# Patient Record
Sex: Male | Born: 1970 | Race: Black or African American | Hispanic: No | Marital: Married | Smoking: Former smoker
Health system: Southern US, Community
[De-identification: ages and names within clinical notes are randomized; demographics above are authoritative.]

## PROBLEM LIST (undated history)

## (undated) DIAGNOSIS — I10 Essential (primary) hypertension: Secondary | ICD-10-CM

## (undated) DIAGNOSIS — J329 Chronic sinusitis, unspecified: Secondary | ICD-10-CM

## (undated) DIAGNOSIS — E1110 Type 2 diabetes mellitus with ketoacidosis without coma: Secondary | ICD-10-CM

## (undated) HISTORY — PX: NO PAST SURGERIES: SHX2092

## (undated) HISTORY — DX: Essential (primary) hypertension: I10

---

## 2011-02-15 LAB — BASIC METABOLIC PANEL
BUN: 13 mg/dL (ref 4–21)
Potassium: 3.9 mmol/L (ref 3.4–5.3)

## 2011-08-12 ENCOUNTER — Encounter: Payer: Self-pay | Admitting: *Deleted

## 2011-08-12 DIAGNOSIS — I10 Essential (primary) hypertension: Secondary | ICD-10-CM | POA: Insufficient documentation

## 2011-08-12 DIAGNOSIS — E119 Type 2 diabetes mellitus without complications: Secondary | ICD-10-CM | POA: Insufficient documentation

## 2011-09-13 ENCOUNTER — Ambulatory Visit: Payer: Self-pay | Admitting: Emergency Medicine

## 2012-05-10 ENCOUNTER — Encounter (HOSPITAL_COMMUNITY): Payer: Self-pay | Admitting: Family Medicine

## 2012-05-10 ENCOUNTER — Inpatient Hospital Stay (HOSPITAL_COMMUNITY): Payer: 59

## 2012-05-10 ENCOUNTER — Emergency Department (HOSPITAL_COMMUNITY): Payer: 59

## 2012-05-10 ENCOUNTER — Inpatient Hospital Stay (HOSPITAL_COMMUNITY)
Admission: EM | Admit: 2012-05-10 | Discharge: 2012-05-15 | DRG: 179 | Disposition: A | Discharge status: Home / Self Care | Payer: 59 | Attending: Internal Medicine | Admitting: Internal Medicine

## 2012-05-10 DIAGNOSIS — I10 Essential (primary) hypertension: Secondary | ICD-10-CM | POA: Diagnosis present

## 2012-05-10 DIAGNOSIS — J850 Gangrene and necrosis of lung: Secondary | ICD-10-CM | POA: Diagnosis present

## 2012-05-10 DIAGNOSIS — E1142 Type 2 diabetes mellitus with diabetic polyneuropathy: Secondary | ICD-10-CM | POA: Diagnosis present

## 2012-05-10 DIAGNOSIS — E1149 Type 2 diabetes mellitus with other diabetic neurological complication: Secondary | ICD-10-CM | POA: Diagnosis present

## 2012-05-10 DIAGNOSIS — J189 Pneumonia, unspecified organism: Secondary | ICD-10-CM

## 2012-05-10 DIAGNOSIS — J852 Abscess of lung without pneumonia: Principal | ICD-10-CM | POA: Diagnosis present

## 2012-05-10 DIAGNOSIS — E119 Type 2 diabetes mellitus without complications: Secondary | ICD-10-CM

## 2012-05-10 LAB — POCT I-STAT, CHEM 8
BUN: 10 mg/dL (ref 6–23)
Calcium, Ion: 1.27 mmol/L — ABNORMAL HIGH (ref 1.12–1.23)
Creatinine, Ser: 0.6 mg/dL (ref 0.50–1.35)
Hemoglobin: 12.9 g/dL — ABNORMAL LOW (ref 13.0–17.0)
Sodium: 131 mEq/L — ABNORMAL LOW (ref 135–145)
TCO2: 14 mmol/L (ref 0–100)

## 2012-05-10 LAB — CBC
Hemoglobin: 11.9 g/dL — ABNORMAL LOW (ref 13.0–17.0)
MCH: 25 pg — ABNORMAL LOW (ref 26.0–34.0)
MCHC: 33.1 g/dL (ref 30.0–36.0)
MCV: 75.4 fL — ABNORMAL LOW (ref 78.0–100.0)
RBC: 4.76 MIL/uL (ref 4.22–5.81)

## 2012-05-10 LAB — GLUCOSE, CAPILLARY

## 2012-05-10 MED ORDER — VANCOMYCIN HCL IN DEXTROSE 1-5 GM/200ML-% IV SOLN
1000.0000 mg | Freq: Once | INTRAVENOUS | Status: AC
  Administered 2012-05-10: 1000 mg via INTRAVENOUS
  Filled 2012-05-10: qty 200

## 2012-05-10 MED ORDER — ONDANSETRON HCL 4 MG/2ML IJ SOLN
4.0000 mg | Freq: Once | INTRAMUSCULAR | Status: AC
  Administered 2012-05-10: 4 mg via INTRAVENOUS
  Filled 2012-05-10: qty 2

## 2012-05-10 MED ORDER — INSULIN ASPART 100 UNIT/ML ~~LOC~~ SOLN
0.0000 [IU] | Freq: Three times a day (TID) | SUBCUTANEOUS | Status: DC
  Administered 2012-05-10: 11 [IU] via SUBCUTANEOUS
  Administered 2012-05-10: 15 [IU] via SUBCUTANEOUS
  Administered 2012-05-11: 7 [IU] via SUBCUTANEOUS
  Administered 2012-05-11: 15 [IU] via SUBCUTANEOUS
  Administered 2012-05-11: 11 [IU] via SUBCUTANEOUS
  Administered 2012-05-12 – 2012-05-13 (×4): 7 [IU] via SUBCUTANEOUS
  Administered 2012-05-13 (×2): 11 [IU] via SUBCUTANEOUS
  Administered 2012-05-14 – 2012-05-15 (×4): 7 [IU] via SUBCUTANEOUS

## 2012-05-10 MED ORDER — INSULIN GLARGINE 100 UNIT/ML ~~LOC~~ SOLN: 5.0000 [IU] | Freq: Every day | SUBCUTANEOUS | Status: DC

## 2012-05-10 MED ORDER — ALBUTEROL SULFATE (5 MG/ML) 0.5% IN NEBU
2.5000 mg | INHALATION_SOLUTION | RESPIRATORY_TRACT | Status: DC | PRN
  Filled 2012-05-10: qty 0.5

## 2012-05-10 MED ORDER — MORPHINE SULFATE 2 MG/ML IJ SOLN
2.0000 mg | INTRAMUSCULAR | Status: DC | PRN
  Administered 2012-05-10 – 2012-05-11 (×4): 2 mg via INTRAVENOUS
  Filled 2012-05-10 (×4): qty 1

## 2012-05-10 MED ORDER — INSULIN ASPART PROT & ASPART (70-30 MIX) 100 UNIT/ML ~~LOC~~ SUSP
10.0000 [IU] | Freq: Once | SUBCUTANEOUS | Status: DC
  Filled 2012-05-10 (×2): qty 10

## 2012-05-10 MED ORDER — INSULIN ASPART 100 UNIT/ML ~~LOC~~ SOLN
0.0000 [IU] | Freq: Every day | SUBCUTANEOUS | Status: DC
  Administered 2012-05-10: 4 [IU] via SUBCUTANEOUS
  Administered 2012-05-11: 3 [IU] via SUBCUTANEOUS
  Administered 2012-05-12: 4 [IU] via SUBCUTANEOUS
  Administered 2012-05-13 – 2012-05-14 (×2): 2 [IU] via SUBCUTANEOUS

## 2012-05-10 MED ORDER — INSULIN ASPART 100 UNIT/ML ~~LOC~~ SOLN
SUBCUTANEOUS | Status: AC
  Filled 2012-05-10: qty 10

## 2012-05-10 MED ORDER — TUBERCULIN PPD 5 UNIT/0.1ML ID SOLN
5.0000 [IU] | Freq: Once | INTRADERMAL | Status: AC
  Administered 2012-05-10: 5 [IU] via INTRADERMAL
  Filled 2012-05-10 (×3): qty 0.1

## 2012-05-10 MED ORDER — VANCOMYCIN HCL 1000 MG IV SOLR
1250.0000 mg | Freq: Two times a day (BID) | INTRAVENOUS | Status: DC
  Administered 2012-05-10 – 2012-05-14 (×8): 1250 mg via INTRAVENOUS
  Filled 2012-05-10 (×10): qty 1250

## 2012-05-10 MED ORDER — METOPROLOL TARTRATE 25 MG PO TABS
25.0000 mg | ORAL_TABLET | Freq: Two times a day (BID) | ORAL | Status: DC
  Administered 2012-05-10 – 2012-05-15 (×11): 25 mg via ORAL
  Filled 2012-05-10 (×12): qty 1

## 2012-05-10 MED ORDER — FENTANYL CITRATE 0.05 MG/ML IJ SOLN
50.0000 ug | Freq: Once | INTRAMUSCULAR | Status: AC
  Administered 2012-05-10: 50 ug via INTRAVENOUS
  Filled 2012-05-10: qty 2

## 2012-05-10 MED ORDER — PIPERACILLIN-TAZOBACTAM 3.375 G IVPB
3.3750 g | Freq: Once | INTRAVENOUS | Status: AC
  Administered 2012-05-10: 3.375 g via INTRAVENOUS
  Filled 2012-05-10: qty 50

## 2012-05-10 MED ORDER — PIPERACILLIN-TAZOBACTAM 3.375 G IVPB
3.3750 g | Freq: Three times a day (TID) | INTRAVENOUS | Status: DC
  Administered 2012-05-10 – 2012-05-15 (×15): 3.375 g via INTRAVENOUS
  Filled 2012-05-10 (×18): qty 50

## 2012-05-10 MED ORDER — INSULIN GLARGINE 100 UNIT/ML ~~LOC~~ SOLN
10.0000 [IU] | Freq: Every day | SUBCUTANEOUS | Status: DC
  Administered 2012-05-10: 10 [IU] via SUBCUTANEOUS

## 2012-05-10 NOTE — ED Notes (Signed)
NS infusing as backup to Vancomycin

## 2012-05-10 NOTE — Progress Notes (Signed)
Text page sent to Dr Rito Ehrlich to inform him of pt on unit room 1517.

## 2012-05-10 NOTE — ED Notes (Signed)
Patient states that he has been coughing and cough has a bad odor. Today has shortness of breath and pain in left rib area.

## 2012-05-10 NOTE — Progress Notes (Signed)
ANTIBIOTIC CONSULT NOTE - INITIAL  Pharmacy Consult for Vancomycin/Zosyn  Indication: Atypical PNA  No Known Allergies  Patient Measurements: Height: 6\' 1"  (185.4 cm) Weight: 275 lb (124.739 kg) IBW/kg (Calculated) : 79.9    Vital Signs: Temp: 98.2 F (36.8 C) (10/10 1022) Temp src: Oral (10/10 1022) BP: 144/88 mmHg (10/10 1022) Pulse Rate: 99  (10/10 1022) Intake/Output from previous day:   Intake/Output from this shift: Total I/O In: -  Out: 700 [Urine:700]  Labs:  [facility] 05/10/12 0632 05/10/12 0625  WBC -- 15.5*  HGB 12.9* 11.9*  PLT -- 309  LABCREA -- --  CREATININE 0.60 --   Estimated Creatinine Clearance: 164.7 ml/min (by C-G formula based on Cr of 0.6). No results found for this basename: VANCOTROUGH:2,VANCOPEAK:2,VANCORANDOM:2,GENTTROUGH:2,GENTPEAK:2,GENTRANDOM:2,TOBRATROUGH:2,TOBRAPEAK:2,TOBRARND:2,AMIKACINPEAK:2,AMIKACINTROU:2,AMIKACIN:2, in the last 72 hours   Microbiology: No results found for this or any previous visit (from the past 720 hour(s)).  Medical History: Past Medical History  Diagnosis Date  . Hypertension   . Diabetes mellitus     Medications:  Scheduled:    . fentaNYL  50 mcg Intravenous Once  . insulin aspart      . insulin aspart      . insulin aspart  0-20 Units Subcutaneous TID WC  . insulin aspart  0-5 Units Subcutaneous QHS  . insulin aspart protamine-insulin aspart  10 Units Subcutaneous Once  . insulin glargine  5 Units Subcutaneous QHS  . metoprolol tartrate  25 mg Oral BID  . ondansetron  4 mg Intravenous Once  . piperacillin-tazobactam (ZOSYN)  IV  3.375 g Intravenous Once  . piperacillin-tazobactam (ZOSYN)  IV  3.375 g Intravenous Q8H  . tuberculin  5 Units Intradermal Once  . vancomycin  1,250 mg Intravenous Q12H  . vancomycin  1,000 mg Intravenous Once  . vancomycin  1,000 mg Intravenous Once   Infusions:   PRN: albuterol, morphine injection Assessment:  41 yo M from home, with necrotizing PNA with  pulmonary abscess started Vancomycin and Zosyn today.  Patient received 2 gram load in ER based on weight of 125 kg, Zosyn also given in ER.   Renal function wnl, WBC elevated, AF  Blood, sputum, AFB cultures pending   Goal of Therapy:  Vancomycin trough level 15-20 mcg/ml Zosyn per renal function   Plan:  1.) Vancomycin 2 gram load given in ER, Continue with Vancomycin 1250 mg IV q12h 2.) Continue Zosyn 3.375 gram IV q8h  3.) Monitor renal function 4.) F/u Cultures   Medha Pippen, Loma Messing PharmD Pager #: [phone] 1:12 PM 05/10/2012

## 2012-05-10 NOTE — Progress Notes (Signed)
portable equipment call about order  For scd's, states that none are available  at this time, but she will put [place] on the list.

## 2012-05-10 NOTE — Progress Notes (Signed)
scd's placed on pt as ordered.

## 2012-05-10 NOTE — ED Provider Notes (Signed)
History     CSN: [id number]  Arrival date & time 05/10/12  0214   First MD Initiated Contact with Patient 05/10/12 [phone]      Chief Complaint  Patient presents with  . Shortness of Breath    (Consider location/radiation/quality/duration/timing/severity/associated sxs/prior treatment) HPI Hx per PT. Wife and daughter have been ill at home, yesterday developed cough, L sided sharp pain with coughing and foul odor with his cough.   No hemoptysis, no fevers, no rash or recent travel. PT denies any medical history or medications, is a smoker. No sig SOB but does hurt to cough and breath. Symptoms MOD in severity Past Medical History  Diagnosis Date  . Hypertension   . Diabetes mellitus     History reviewed. No pertinent past surgical history.  No family history on file.  History  Substance Use Topics  . Smoking status: Current Every Day Smoker -- 0.2 packs/day    Types: Cigarettes  . Smokeless tobacco: Not on file  . Alcohol Use: Yes     once a month      Review of Systems  Constitutional: Negative for fever and chills.  HENT: Negative for neck pain and neck stiffness.   Eyes: Negative for pain.  Respiratory: Positive for cough. Negative for wheezing.   Cardiovascular: Positive for chest pain.  Gastrointestinal: Negative for abdominal pain.  Genitourinary: Negative for dysuria.  Musculoskeletal: Negative for back pain.  Skin: Negative for rash.  Neurological: Negative for headaches.  All other systems reviewed and are negative.    Allergies  Review of patient's allergies indicates no known allergies.  Home Medications  No current outpatient prescriptions on file.  BP 141/92  Pulse 102  Temp 99.2 F (37.3 C) (Oral)  Resp 19  Ht 6\' 1"  (1.854 m)  Wt 275 lb (124.739 kg)  BMI 36.28 kg/m2  SpO2 97%  Physical Exam  Constitutional: He is oriented to person, place, and time. He appears well-developed and well-nourished.  HENT:  Head: Normocephalic and  atraumatic.  Eyes: Conjunctivae normal and EOM are normal. Pupils are equal, round, and reactive to light.  Neck: Trachea normal. Neck supple. No thyromegaly present.  Cardiovascular: Normal rate, regular rhythm, S1 normal, S2 normal and normal pulses.     No systolic murmur is present   No diastolic murmur is present  Pulses:      Radial pulses are 2+ on the right side, and 2+ on the left side.  Pulmonary/Chest: Effort normal. He has no wheezes. He has no rhonchi. He has no rales. He exhibits no tenderness.       Dec breath sounds on the left posterior with splinting  Abdominal: Soft. Normal appearance and bowel sounds are normal. There is no tenderness. There is no CVA tenderness and negative Murphy's sign.  Musculoskeletal:       BLE:s Calves nontender, no cords or erythema, negative Homans sign  Neurological: He is alert and oriented to person, place, and time. He has normal strength. No cranial nerve deficit or sensory deficit. GCS eye subscore is 4. GCS verbal subscore is 5. GCS motor subscore is 6.  Skin: Skin is warm and dry. No rash noted. He is not diaphoretic.  Psychiatric: His speech is normal.       Cooperative and appropriate    ED Course  Procedures (including critical care time)  Results for orders placed during the hospital encounter of 05/10/12  CBC      Component Value Range   WBC 15.5 (*)  4.0 - 10.5 K/uL   RBC 4.76  4.22 - 5.81 MIL/uL   Hemoglobin 11.9 (*) 13.0 - 17.0 g/dL   HCT 96.0 (*) 45.4 - 09.8 %   MCV 75.4 (*) 78.0 - 100.0 fL   MCH 25.0 (*) 26.0 - 34.0 pg   MCHC 33.1  30.0 - 36.0 g/dL   RDW 11.9  14.7 - 82.9 %   Platelets 309  150 - 400 K/uL  POCT I-STAT, CHEM 8      Component Value Range   Sodium 131 (*) 135 - 145 mEq/L   Potassium 4.1  3.5 - 5.1 mEq/L   Chloride 103  96 - 112 mEq/L   BUN 10  6 - 23 mg/dL   Creatinine, Ser 5.62  0.50 - 1.35 mg/dL   Glucose, Bld 130 (*) 70 - 99 mg/dL   Calcium, Ion 8.65 (*) 1.12 - 1.23 mmol/L   TCO2 14  0 - 100  mmol/L   Hemoglobin 12.9 (*) 13.0 - 17.0 g/dL   HCT 78.4 (*) 69.6 - 29.5 %   Dg Chest 2 View  05/10/2012  *RADIOLOGY REPORT*  Clinical Data: 41 year old male with cough congestion shortness of breath left chest pain.  CHEST - 2 VIEW  Comparison: None.  Findings: Confluent but somewhat indistinct airspace opacity in the left lung appears  localized to the lower lobe on the lateral view. In the superior segment there is an air-fluid level.  No pleural effusion.  The left upper lobe appears clear.  The right lung appears clear.  Cardiac size and mediastinal contours are within normal limits.  Visualized tracheal air column is within normal limits.  No acute osseous abnormality identified.  IMPRESSION: Constellation of findings most suspicious for left lower lobe necrotizing pneumonia with pulmonary abscess. Also consider superinfection of a preexisting pulmonary cavity with surrounding pneumonia. Given the surrounding airspace disease, cavitary neoplasm is not favored.   Original Report Authenticated By: Harley Hallmark, M.D.    Broad spectrum ABx for concerning CXR findings. Labs reviewed.   7:25 AM d/w Rito Ehrlich, [facility], plan admit IV ABx - agrees Vanc Zosyn and novolog 10 units now.   MDM   Cough and SOB and CXR with  left lower lobe necrotizing pneumonia with pulmonary abscess.IV ABx, O2. MED admit       Sunnie Nielsen, MD 05/10/12 [phone]

## 2012-05-10 NOTE — H&P (Addendum)
[facility] History and Physical  Seth Cruz AVW:[id number] DOB: [birth year] DOA: 05/10/2012  Referring physician: Dierdre Highman, ER Physician PCP: The patient does not have a primary care physician Specialists: None  Chief Complaint: Cough & SOB  HPI: Seth Cruz is a 41 y.o. male with past medical history of diabetes mellitus and hypertension although he's been noncompliant with medications who has for the last few days had progressively worsening shortness of breath and cough. This continued to worsen. He describes as the cough is productive with what he says is black sputum. No blood. No wheezing. No chest pain. He came into emergency room today as his symptoms progressed. In the emergency room he was noted to have a white count of 15 with normal oxygen saturations on room air. Most concerning was his chest x-ray which noted left lower lobe necrotizing pneumonia with possible pulmonary cavity and abscess. Hospitalists were called for further evaluation.  Review of Systems:  When I saw the patient after he had been transferred out of the emergency room, he was feeling weak. He status and a little but better after getting some IV fluids and a breathing treatment. He denies any headaches, vision changes, dysphasia, chest pain, palpitations, wheezing, abdominal pain, hematuria, dysuria, constipation, diarrhea, focal extremity numbness weakness or pain. His main complaints were of an active cough occasionally with black sputum, hurting all over especially from coughing so much and generalized weakness. His review of systems otherwise negative.  Past Medical History  Diagnosis Date  . Hypertension   . Diabetes mellitus    History reviewed. No pertinent past surgical history. Social History:  reports that he has been smoking Cigarettes.  He has been smoking about .25 packs per day. He does not have any smokeless tobacco history on file. He reports that he drinks alcohol. He reports that he uses  illicit drugs. Lives at home by himself, able to participate in all activities of daily living  No Known Allergies  Fam Hx: HTN & DM on Mom's side  Prior to Admission medications   Not on File   Physical Exam: Filed Vitals:   05/10/12 0230 05/10/12 0751 05/10/12 1022  BP: 141/92 128/88 144/88  Pulse: 102 94 99  Temp: 99.2 F (37.3 C) 98.7 F (37.1 C) 98.2 F (36.8 C)  TempSrc: Oral Oral Oral  Resp: 19 24 20   Height: 6\' 1"  (1.854 m)    Weight: 124.739 kg (275 lb)    SpO2: 97% 99% 96%     General:  Alert and oriented x3, fatigue, looks younger than stated age  Eyes: Sclera nonicteric, extraocular movements are intact  ENT: Normocephalic, atraumatic, mucous membranes are slightly dry  Neck: No JVD no carotid bruits  Cardiovascular: Regular rate and rhythm, S1-S2  Respiratory: Left lower lobe rhonchi  Abdomen: Soft, nontender, nondistended, positive bowel sounds  Skin: No skin tears, breaks or lesions  Musculoskeletal: No clubbing or cyanosis or edema  Psychiatric: Appropriate, no evidence of psychoses  Neurologic: No focal deficits  Labs on Admission:  Basic Metabolic Panel:  Lab 05/10/12 8295  NA 131*  K 4.1  CL 103  CO2 --  GLUCOSE 357*  BUN 10  CREATININE 0.60  CALCIUM --  MG --  PHOS --   CBC:  Lab 05/10/12 0632 05/10/12 0625  WBC -- 15.5*  NEUTROABS -- --  HGB 12.9* 11.9*  HCT 38.0* 35.9*  MCV -- 75.4*  PLT -- 309   CBG:  Lab 05/10/12 1045 05/10/12 0751  GLUCAP 349*  343*    Radiological Exams on Admission: Dg Chest 2 View  05/10/2012   IMPRESSION: Constellation of findings most suspicious for left lower lobe necrotizing pneumonia with pulmonary abscess. Also consider superinfection of a preexisting pulmonary cavity with surrounding pneumonia. Given the surrounding airspace disease, cavitary neoplasm is not favored.   EKG: Independently reviewed. Sinus tachycardia  Assessment/Plan Principal Problem:  *Atypical pneumonia:  Certainly concerning for the possibility of TB. We'll place in isolation droplet, PPD, CT scan of the chest. Plan for broad-spectrum antibiotic coverage and adjust accordingly. Given young age and atypical findings, checking HIV test  1. Hypertension when necessary Lopressor 2. Diabetes: Check A1c. Lantus plus sliding scale  Code Status: Full code (must indicate code status--if unknown or must be presumed, indicate so) Family Communication: Patient is not married and plan discussed only with him (indicate person spoken with, if applicable, with phone number if by telephone) Disposition Plan: Likely will be here for at least 2-3 days until we have better determine what exactly is causing his pneumonia. (indicate anticipated LOS)  Time spent: 50 minutes  Hollice Espy [facility] Pager [phone]   If 7PM-7AM, please contact night-coverage [email or web address] Password [facility] 05/10/2012, 4:38 PM

## 2012-05-10 NOTE — Progress Notes (Signed)
Order noted for pt to be on airborne precautions.  This unit has no negative pressure rooms Dr Rito Ehrlich informed to the need to transfer pt to another floor.  Bed request placed.

## 2012-05-11 DIAGNOSIS — J852 Abscess of lung without pneumonia: Principal | ICD-10-CM

## 2012-05-11 LAB — BASIC METABOLIC PANEL
BUN: 9 mg/dL (ref 6–23)
Chloride: 97 mEq/L (ref 96–112)
Creatinine, Ser: 0.44 mg/dL — ABNORMAL LOW (ref 0.50–1.35)
GFR calc Af Amer: >90 mL/min (ref >90)

## 2012-05-11 LAB — GLUCOSE, CAPILLARY
Glucose-Capillary: 251 mg/dL — ABNORMAL HIGH (ref 70–99)
Glucose-Capillary: 210 mg/dL — ABNORMAL HIGH (ref 70–99)
Glucose-Capillary: 330 mg/dL — ABNORMAL HIGH (ref 70–99)
Glucose-Capillary: 283 mg/dL — ABNORMAL HIGH (ref 70–99)

## 2012-05-11 LAB — HIV ANTIBODY (ROUTINE TESTING W REFLEX): HIV: NONREACTIVE

## 2012-05-11 LAB — EXPECTORATED SPUTUM ASSESSMENT W GRAM STAIN, RFLX TO RESP C

## 2012-05-11 LAB — CBC
HCT: 32.9 % — ABNORMAL LOW (ref 39.0–52.0)
MCH: 25.1 pg — ABNORMAL LOW (ref 26.0–34.0)
MCHC: 33.4 g/dL (ref 30.0–36.0)
RDW: 13.9 % (ref 11.5–15.5)

## 2012-05-11 MED ORDER — INSULIN GLARGINE 100 UNIT/ML ~~LOC~~ SOLN
13.0000 [IU] | Freq: Every day | SUBCUTANEOUS | Status: DC
  Administered 2012-05-11: 13 [IU] via SUBCUTANEOUS

## 2012-05-11 MED ORDER — GLUCERNA SHAKE PO LIQD
237.0000 mL | Freq: Two times a day (BID) | ORAL | Status: DC
  Administered 2012-05-11 – 2012-05-14 (×5): 237 mL via ORAL
  Filled 2012-05-11 (×10): qty 237

## 2012-05-11 NOTE — Progress Notes (Signed)
[facility]             Progress Note   Subjective: Has a lot of chest wall pain with coughing. Is producing large amounts of blackish-colored sputum.  Objective: Vital signs in last 24 hours: Temp:  [98 F (36.7 C)-98.7 F (37.1 C)] 98 F (36.7 C) (10/11 1330) Pulse Rate:  [86-89] 88  (10/11 1330) Resp:  [18-20] 18  (10/11 1330) BP: (113-123)/(72-80) 113/76 mmHg (10/11 1330) SpO2:  [99 %-100 %] 100 % (10/11 1330) Weight:  [68.5 kg (151 lb 0.2 oz)] 68.5 kg (151 lb 0.2 oz) (10/10 1950) Weight change: -56.239 kg (-123 lb 15.8 oz) Last BM Date: 05/08/12  Intake/Output from previous day: 10/10 0701 - 10/11 0700 In: 240 [P.O.:240] Out: 700 [Urine:700] Total I/O In: 480 [P.O.:480] Out: -    Physical Exam: General: Alert, awake, oriented x3. HEENT: No bruits, no goiter. Heart: Regular rate and rhythm, without murmurs, rubs, gallops. Lungs: Clear to auscultation bilaterally. Abdomen: Soft, nontender, nondistended, positive bowel sounds. Extremities: No clubbing cyanosis or edema with positive pedal pulses. Neuro: Grossly intact, nonfocal.    Lab Results: Basic Metabolic Panel:  Basename 05/11/12 0340 05/10/12 0632  NA 131* 131*  K 4.0 4.1  CL 97 103  CO2 21 --  GLUCOSE 268* 357*  BUN 9 10  CREATININE 0.44* 0.60  CALCIUM 9.4 --  MG -- --  PHOS -- --   CBC:  Basename 05/11/12 0340 05/10/12 0632 05/10/12 0625  WBC 11.6* -- 15.5*  NEUTROABS -- -- --  HGB 11.0* 12.9* --  HCT 32.9* 38.0* --  MCV 75.1* -- 75.4*  PLT 296 -- 309   CBG:  Basename 05/11/12 1709 05/11/12 1137 05/11/12 0749 05/11/12 0440 05/10/12 2357 05/10/12 1952  GLUCAP 283* 330* 210* 251* 275* 333*   Hemoglobin A1C:  Basename 05/10/12 0625  HGBA1C 16.7*    Recent Results (from the past 240 hour(s))  CULTURE, BLOOD (ROUTINE X 2)     Status: Normal (Preliminary result)   Collection Time   05/10/12 12:15 PM      Component Value Range Status Comment   Specimen Description  BLOOD LEFT ARM   Final    Special Requests BOTTLES DRAWN AEROBIC AND ANAEROBIC 10CC   Final    Culture  Setup Time 05/10/2012 21:04   Final    Culture     Final    Value:        BLOOD CULTURE RECEIVED NO GROWTH TO DATE CULTURE WILL BE HELD FOR 5 DAYS BEFORE ISSUING A FINAL NEGATIVE REPORT   Report Status PENDING   Incomplete   CULTURE, BLOOD (ROUTINE X 2)     Status: Normal (Preliminary result)   Collection Time   05/10/12  1:20 PM      Component Value Range Status Comment   Specimen Description BLOOD LEFT ARM   Final    Special Requests BOTTLES DRAWN AEROBIC AND ANAEROBIC 10CC   Final    Culture  Setup Time 05/10/2012 21:04   Final    Culture     Final    Value:        BLOOD CULTURE RECEIVED NO GROWTH TO DATE CULTURE WILL BE HELD FOR 5 DAYS BEFORE ISSUING A FINAL NEGATIVE REPORT   Report Status PENDING   Incomplete   CULTURE, EXPECTORATED SPUTUM-ASSESSMENT     Status: Normal   Collection Time   05/11/12 12:23 PM      Component Value Range Status Comment   Specimen  Description SPUTUM   Final    Special Requests Normal   Final    Sputum evaluation     Final    Value: THIS SPECIMEN IS ACCEPTABLE. RESPIRATORY CULTURE REPORT TO FOLLOW.   Report Status 05/11/2012 FINAL   Final     Studies/Results: Dg Chest 2 View  05/10/2012  *RADIOLOGY REPORT*  Clinical Data: 41 year old male with cough congestion shortness of breath left chest pain.  CHEST - 2 VIEW  Comparison: None.  Findings: Confluent but somewhat indistinct airspace opacity in the left lung appears  localized to the lower lobe on the lateral view. In the superior segment there is an air-fluid level.  No pleural effusion.  The left upper lobe appears clear.  The right lung appears clear.  Cardiac size and mediastinal contours are within normal limits.  Visualized tracheal air column is within normal limits.  No acute osseous abnormality identified.  IMPRESSION: Constellation of findings most suspicious for left lower lobe necrotizing  pneumonia with pulmonary abscess. Also consider superinfection of a preexisting pulmonary cavity with surrounding pneumonia. Given the surrounding airspace disease, cavitary neoplasm is not favored.   Original Report Authenticated By: Harley Hallmark, M.D.    Ct Chest Wo Contrast  05/10/2012  *RADIOLOGY REPORT*  Clinical Data: Possible pulmonary abscess.  Cough.  Left-sided pain anteriorly with sputum.  The patient is a smoker.  History of hypertension and diabetes.  CT CHEST WITHOUT CONTRAST  Technique:  Multidetector CT imaging of the chest was performed following the standard protocol without IV contrast.  Comparison: Plain film of earlier today at 0517 hours.  Findings: Lung windows demonstrate patchy airspace and ground-glass opacity within the basilar segments of the left lower lobe, extending into the superior segment.  Within the superior segment laterally, is a cavitary component which measures 2.6 x 2.6 cm on image 26. No pneumothorax.  No communication with the bronchial tree identified.  Soft tissue windows demonstrate normal heart size.  Trace left- sided pleural fluid. No mediastinal or definite hilar adenopathy, given limitations of unenhanced CT.  Limited abdominal imaging demonstrates no significant findings.  No acute osseous abnormality.  IMPRESSION:  1.  Left lower lobe airspace and ground-glass opacity, most consistent with infection.  Cavitary component in the superior segment is most consistent with necrotizing pneumonia.  Recommend radiographic versus CT follow-up to confirm clearing. 2.  Trace left pleural effusion.   Original Report Authenticated By: Consuello Bossier, M.D.     Medications: Scheduled Meds:    . feeding supplement  237 mL Oral BID BM  . insulin aspart      . insulin aspart      . insulin aspart  0-20 Units Subcutaneous TID WC  . insulin aspart  0-5 Units Subcutaneous QHS  . insulin aspart protamine-insulin aspart  10 Units Subcutaneous Once  . insulin glargine   10 Units Subcutaneous QHS  . metoprolol tartrate  25 mg Oral BID  . piperacillin-tazobactam (ZOSYN)  IV  3.375 g Intravenous Q8H  . vancomycin  1,250 mg Intravenous Q12H   Continuous Infusions:  PRN Meds:.albuterol, morphine injection  Assessment/Plan:  Principal Problem:  *Necrotizing pneumonia Active Problems:  Hypertension  Diabetes mellitus    Necrotizing Pneumonia -Agree with current antibiotics (especially vanc as staph PNA is high in the differential). -Need to r/o TB with cavitary lesion, however I think this is unlikely. -RT to induce sputum for AFB x 3. -Needs to remain in airborne isolation until we have 3 negative AFB smears. -  Sputum cx. -Blood Cx. -Strep pneumo/legionella urine antigens ordered.  DM -CBGs elevated. -Increase lantus to 13 units.  HTN -Well controlled.   Time spent coordinating care: 35 minutes.   LOS: 1 day   [facility] Pager: [phone] 05/11/2012, 6:24 PM

## 2012-05-11 NOTE — Progress Notes (Signed)
Inpatient Diabetes Program Recommendations  AACE/ADA: New Consensus Statement on Inpatient Glycemic Control (2013)  Target Ranges:  Prepandial:   less than 140 mg/dL      Peak postprandial:   less than 180 mg/dL (1-2 hours)      Critically ill patients:  140 - 180 mg/dL   Reason for Assessment:  Hyperglycemia  41 y.o. male with past medical history of diabetes mellitus and hypertension although he's been noncompliant with medications who has for the last few days had progressively worsening shortness of breath and cough.  Hx of HTN and DM.  Has taken metformin in the past.  Does not follow diabetic diet.  Appetite improving.  Results for TAHEEM, ROXBURY (MRN [id number]) as of 05/11/2012 16:43  Ref. Range 05/10/2012 06:25  Hemoglobin A1C Latest Range: <5.7 % 16.7 (H)  Results for QUNICY, SWIGGUM (MRN [id number]) as of 05/11/2012 16:43  Ref. Range 05/10/2012 06:32 05/11/2012 03:40  Glucose No range found 357 (H) 268 (H)  Results for KAIVON, CONDITT (MRN [id number]) as of 05/11/2012 16:43  Ref. Range 05/10/2012 07:51 05/10/2012 10:45 05/10/2012 17:36 05/10/2012 19:52 05/10/2012 23:57 05/11/2012 04:40 05/11/2012 07:49 05/11/2012 11:37  Glucose-Capillary Latest Range: 70-99 mg/dL 284 (H) 132 (H) 440 (H) 333 (H) 275 (H) 251 (H) 210 (H) 330 (H)   Uncontrolled DM with 16.7% HgbA1C.  Recommendations:  70/30 15 units bid if pt continues good po intake.  Will follow. Thank you.  Ailene Ards, RD, LDN, CDE Inpatient Diabetes Coordinator

## 2012-05-11 NOTE — Progress Notes (Signed)
INITIAL ADULT NUTRITION ASSESSMENT Date: 05/11/2012   Time: 11:14 AM Reason for Assessment: Nutrition risk   INTERVENTION: Glucerna shake BID. Encouraged continued excellent intake. Attempted diabetic diet education, however pt stated he knows what he should be eating, just has not been doing it. Pt may benefit from outpatient RD follow-up if pt agreeable. Will monitor.    Pt meets criteria for moderate malnutrition of acute illness AEB <75% energy intake with 9% weight loss in the past month per pt report.     ASSESSMENT: Male 41 y.o.  Dx: Atypical pneumonia  Food/Nutrition Related Hx: Pt reports poor appetite since Wednesday PTA - pt states he has been trying to eat his usual 2 meals/day but has been unable to finish them. Pt reports 15 pound unintended weight loss in the past month. Pt states before then he was eating 2 meals/day. Pt reports he does not check his blood sugars like he should at home, did not state why. Pt states his appetite is returning and that he ate 100% of breakfast this morning.   Hx:  Past Medical History  Diagnosis Date  . Hypertension   . Diabetes mellitus    Related Meds:  Scheduled Meds:   . insulin aspart      . insulin aspart      . insulin aspart  0-20 Units Subcutaneous TID WC  . insulin aspart  0-5 Units Subcutaneous QHS  . insulin aspart protamine-insulin aspart  10 Units Subcutaneous Once  . insulin glargine  10 Units Subcutaneous QHS  . metoprolol tartrate  25 mg Oral BID  . piperacillin-tazobactam (ZOSYN)  IV  3.375 g Intravenous Q8H  . tuberculin  5 Units Intradermal Once  . vancomycin  1,250 mg Intravenous Q12H  . DISCONTD: insulin glargine  5 Units Subcutaneous QHS   Continuous Infusions:  PRN Meds:.albuterol, morphine injection  Ht: 6\' 1"  (185.4 cm)  Wt: 151 lb 0.2 oz (68.5 kg)  Ideal Wt: 184 lb % Ideal Wt: 82  Usual Wt: 166 lb per pt report % Usual Wt: 91  Body mass index is 19.92 kg/(m^2).   Labs: CMP       Component Value Date/Time   NA 131* 05/11/2012 0340   NA 134* 02/15/2011   K 4.0 05/11/2012 0340   CL 97 05/11/2012 0340   CO2 21 05/11/2012 0340   GLUCOSE 268* 05/11/2012 0340   BUN 9 05/11/2012 0340   BUN 13 02/15/2011   CREATININE 0.44* 05/11/2012 0340   CREATININE 0.9 02/15/2011   CALCIUM 9.4 05/11/2012 0340   GFRNONAA >90 05/11/2012 0340   GFRAA >90 05/11/2012 0340   Lab Results  Component Value Date   HGBA1C 16.7* 05/10/2012   CBG (last 3)   Basename 05/11/12 0749 05/11/12 0440 05/10/12 2357  GLUCAP 210* 251* 275*    Intake/Output Summary (Last 24 hours) at 05/11/12 1119 Last data filed at 05/11/12 0949  Gross per 24 hour  Intake    480 ml  Output      0 ml  Net    480 ml   Last BM - 10/8   Diet Order: Carb Control   IVF:    Estimated Nutritional Needs:   Kcal:2250-2600 Protein:80-105g Fluid:2.2-2.6L  NUTRITION DIAGNOSIS: -Predicted suboptimal energy intake (NI-1.6).  Status: Ongoing  RELATED TO: poor appetite PTA  AS EVIDENCE BY: pt statement  MONITORING/EVALUATION(Goals): Pt to consume >90% of meals/supplements  EDUCATION NEEDS: -No education needs identified at this time - pt states he is aware of diet  for diabetes, however he chooses to ignore it. Pt denied having any educational needs.   Dietitian #: [phone]  DOCUMENTATION CODES Per approved criteria  -Non-severe (moderate) malnutrition in the context of acute illness or injury    Marshall Cork 05/11/2012, 11:14 AM

## 2012-05-12 LAB — GLUCOSE, CAPILLARY
Glucose-Capillary: 215 mg/dL — ABNORMAL HIGH (ref 70–99)
Glucose-Capillary: 227 mg/dL — ABNORMAL HIGH (ref 70–99)

## 2012-05-12 LAB — BASIC METABOLIC PANEL
BUN: 10 mg/dL (ref 6–23)
CO2: 27 mEq/L (ref 19–32)
Chloride: 97 mEq/L (ref 96–112)
GFR calc non Af Amer: >90 mL/min (ref >90)
Glucose, Bld: 262 mg/dL — ABNORMAL HIGH (ref 70–99)
Potassium: 3.5 mEq/L (ref 3.5–5.1)
Sodium: 133 mEq/L — ABNORMAL LOW (ref 135–145)

## 2012-05-12 LAB — CBC
HCT: 32.7 % — ABNORMAL LOW (ref 39.0–52.0)
Hemoglobin: 10.9 g/dL — ABNORMAL LOW (ref 13.0–17.0)
MCH: 24.8 pg — ABNORMAL LOW (ref 26.0–34.0)
RBC: 4.4 MIL/uL (ref 4.22–5.81)

## 2012-05-12 MED ORDER — INSULIN GLARGINE 100 UNIT/ML ~~LOC~~ SOLN
15.0000 [IU] | Freq: Every day | SUBCUTANEOUS | Status: DC
  Administered 2012-05-12: 15 [IU] via SUBCUTANEOUS

## 2012-05-12 NOTE — Progress Notes (Signed)
[facility]             Progress Note   Subjective: Feels much better today. Still with large amounts of sputum production.  Objective: Vital signs in last 24 hours: Temp:  [97.9 F (36.6 C)-98 F (36.7 C)] 98 F (36.7 C) (10/12 0651) Pulse Rate:  [80-88] 80  (10/12 0651) Resp:  [18-20] 20  (10/12 0651) BP: (113-134)/(76-93) 132/84 mmHg (10/12 0651) SpO2:  [99 %-100 %] 99 % (10/12 0651) Weight change:  Last BM Date: 05/09/12  Intake/Output from previous day: 10/11 0701 - 10/12 0700 In: 780 [P.O.:480; IV Piggyback:300] Out: -  Total I/O In: 240 [P.O.:240] Out: -    Physical Exam: General: Alert, awake, oriented x3. HEENT: No bruits, no goiter. Heart: Regular rate and rhythm, without murmurs, rubs, gallops. Lungs: Clear to auscultation bilaterally. Abdomen: Soft, nontender, nondistended, positive bowel sounds. Extremities: No clubbing cyanosis or edema with positive pedal pulses. Neuro: Grossly intact, nonfocal.    Lab Results: Basic Metabolic Panel:  Basename 05/12/12 0352 05/11/12 0340  NA 133* 131*  K 3.5 4.0  CL 97 97  CO2 27 21  GLUCOSE 262* 268*  BUN 10 9  CREATININE 0.46* 0.44*  CALCIUM 9.1 9.4  MG -- --  PHOS -- --   CBC:  Basename 05/12/12 0352 05/11/12 0340  WBC 8.1 11.6*  NEUTROABS -- --  HGB 10.9* 11.0*  HCT 32.7* 32.9*  MCV 74.3* 75.1*  PLT 312 296   CBG:  Basename 05/12/12 1157 05/12/12 0736 05/11/12 2206 05/11/12 1709 05/11/12 1137 05/11/12 0749  GLUCAP 207* 215* 300* 283* 330* 210*   Hemoglobin A1C:  Basename 05/10/12 0625  HGBA1C 16.7*    Recent Results (from the past 240 hour(s))  CULTURE, BLOOD (ROUTINE X 2)     Status: Normal (Preliminary result)   Collection Time   05/10/12 12:15 PM      Component Value Range Status Comment   Specimen Description BLOOD LEFT ARM   Final    Special Requests BOTTLES DRAWN AEROBIC AND ANAEROBIC 10CC   Final    Culture  Setup Time 05/10/2012 21:04   Final    Culture      Final    Value:        BLOOD CULTURE RECEIVED NO GROWTH TO DATE CULTURE WILL BE HELD FOR 5 DAYS BEFORE ISSUING A FINAL NEGATIVE REPORT   Report Status PENDING   Incomplete   CULTURE, BLOOD (ROUTINE X 2)     Status: Normal (Preliminary result)   Collection Time   05/10/12  1:20 PM      Component Value Range Status Comment   Specimen Description BLOOD LEFT ARM   Final    Special Requests BOTTLES DRAWN AEROBIC AND ANAEROBIC 10CC   Final    Culture  Setup Time 05/10/2012 21:04   Final    Culture     Final    Value:        BLOOD CULTURE RECEIVED NO GROWTH TO DATE CULTURE WILL BE HELD FOR 5 DAYS BEFORE ISSUING A FINAL NEGATIVE REPORT   Report Status PENDING   Incomplete   CULTURE, EXPECTORATED SPUTUM-ASSESSMENT     Status: Normal   Collection Time   05/11/12 12:23 PM      Component Value Range Status Comment   Specimen Description SPUTUM   Final    Special Requests Normal   Final    Sputum evaluation     Final    Value: THIS SPECIMEN IS ACCEPTABLE.  RESPIRATORY CULTURE REPORT TO FOLLOW.   Report Status 05/11/2012 FINAL   Final     Studies/Results: Ct Chest Wo Contrast  05/10/2012  *RADIOLOGY REPORT*  Clinical Data: Possible pulmonary abscess.  Cough.  Left-sided pain anteriorly with sputum.  The patient is a smoker.  History of hypertension and diabetes.  CT CHEST WITHOUT CONTRAST  Technique:  Multidetector CT imaging of the chest was performed following the standard protocol without IV contrast.  Comparison: Plain film of earlier today at 0517 hours.  Findings: Lung windows demonstrate patchy airspace and ground-glass opacity within the basilar segments of the left lower lobe, extending into the superior segment.  Within the superior segment laterally, is a cavitary component which measures 2.6 x 2.6 cm on image 26. No pneumothorax.  No communication with the bronchial tree identified.  Soft tissue windows demonstrate normal heart size.  Trace left- sided pleural fluid. No mediastinal or  definite hilar adenopathy, given limitations of unenhanced CT.  Limited abdominal imaging demonstrates no significant findings.  No acute osseous abnormality.  IMPRESSION:  1.  Left lower lobe airspace and ground-glass opacity, most consistent with infection.  Cavitary component in the superior segment is most consistent with necrotizing pneumonia.  Recommend radiographic versus CT follow-up to confirm clearing. 2.  Trace left pleural effusion.   Original Report Authenticated By: Consuello Bossier, M.D.     Medications: Scheduled Meds:    . feeding supplement  237 mL Oral BID BM  . insulin aspart  0-20 Units Subcutaneous TID WC  . insulin aspart  0-5 Units Subcutaneous QHS  . insulin aspart protamine-insulin aspart  10 Units Subcutaneous Once  . insulin glargine  13 Units Subcutaneous QHS  . metoprolol tartrate  25 mg Oral BID  . piperacillin-tazobactam (ZOSYN)  IV  3.375 g Intravenous Q8H  . vancomycin  1,250 mg Intravenous Q12H  . DISCONTD: insulin glargine  10 Units Subcutaneous QHS   Continuous Infusions:  PRN Meds:.albuterol, morphine injection  Assessment/Plan:  Principal Problem:  *Necrotizing pneumonia Active Problems:  Hypertension  Diabetes mellitus    Necrotizing Pneumonia -Agree with current antibiotics (especially vanc as staph PNA is high in the differential). -Need to r/o TB with cavitary lesion, however I think this is unlikely. -RT to induce sputum for AFB x 3. -Needs to remain in airborne isolation until we have 3 negative AFB smears. -Sputum cx. -Blood Cx. -Strep pneumo/legionella urine antigens ordered. -Leukocytosis resolved. -Has been afebrile since admission.  DM -CBGs remain elevated. -Increase lantus to 15 units.  HTN -Well controlled.   Time spent coordinating care: 35 minutes.   LOS: 2 days   [facility] Pager: [phone] 05/12/2012, 12:27 PM

## 2012-05-12 NOTE — Progress Notes (Signed)
At around 1:00 am pt called nurse into room and reported coughing up blood, a small amount of blood was observed in the sputum.  Told pt to tell RN if this occurs again.  Will continue to monitor.

## 2012-05-12 NOTE — Progress Notes (Signed)
Order for sputum induction : HHN with hypertonic saline administered/specimen sample cup at bedside. Pt tolerated well.

## 2012-05-12 NOTE — Progress Notes (Signed)
PPD read  Results:  Negative  Edison Nasuti

## 2012-05-13 LAB — GLUCOSE, CAPILLARY: Glucose-Capillary: 286 mg/dL — ABNORMAL HIGH (ref 70–99)

## 2012-05-13 LAB — LEGIONELLA ANTIGEN, URINE

## 2012-05-13 LAB — STREP PNEUMONIAE URINARY ANTIGEN: Strep Pneumo Urinary Antigen: NEGATIVE

## 2012-05-13 LAB — VANCOMYCIN, TROUGH: Vancomycin Tr: 5.3 ug/mL — ABNORMAL LOW (ref 10.0–20.0)

## 2012-05-13 MED ORDER — INSULIN GLARGINE 100 UNIT/ML ~~LOC~~ SOLN
18.0000 [IU] | Freq: Every day | SUBCUTANEOUS | Status: DC
  Administered 2012-05-13: 18 [IU] via SUBCUTANEOUS

## 2012-05-13 NOTE — Plan of Care (Signed)
Problem: Phase I Progression Outcomes Goal: CBGs steadily decreasing with treatment Outcome: Not Met (add Reason) Cbg is fluctuating in 200-300s. Increased long acting . Will cont to monitor

## 2012-05-13 NOTE — Progress Notes (Signed)
ANTIBIOTIC CONSULT NOTE - INITIAL  Pharmacy Consult for Vancomycin/Zosyn  Indication: Necrotizing PNA  No Known Allergies  Patient Measurements: Height: 6\' 1"  (185.4 cm) Weight: 151 lb 0.2 oz (68.5 kg) IBW/kg (Calculated) : 79.9    Vital Signs: Temp: 98.5 F (36.9 C) (10/13 0615) Temp src: Oral (10/13 0615) BP: 129/85 mmHg (10/13 0615) Pulse Rate: 86  (10/13 0615) Intake/Output from previous day: 10/12 0701 - 10/13 0700 In: 1180 [P.O.:480; IV Piggyback:700] Out: -  Intake/Output from this shift:    Labs:  Basename 05/12/12 0352 05/11/12 0340  WBC 8.1 11.6*  HGB 10.9* 11.0*  PLT 312 296  LABCREA -- --  CREATININE 0.46* 0.44*   Estimated Creatinine Clearance: 115.4 ml/min (by C-G formula based on Cr of 0.46). No results found for this basename: VANCOTROUGH:2,VANCOPEAK:2,VANCORANDOM:2,GENTTROUGH:2,GENTPEAK:2,GENTRANDOM:2,TOBRATROUGH:2,TOBRAPEAK:2,TOBRARND:2,AMIKACINPEAK:2,AMIKACINTROU:2,AMIKACIN:2, in the last 72 hours   Microbiology: Recent Results (from the past 720 hour(s))  CULTURE, BLOOD (ROUTINE X 2)     Status: Normal (Preliminary result)   Collection Time   05/10/12 12:15 PM      Component Value Range Status Comment   Specimen Description BLOOD LEFT ARM   Final    Special Requests BOTTLES DRAWN AEROBIC AND ANAEROBIC 10CC   Final    Culture  Setup Time 05/10/2012 21:04   Final    Culture     Final    Value:        BLOOD CULTURE RECEIVED NO GROWTH TO DATE CULTURE WILL BE HELD FOR 5 DAYS BEFORE ISSUING A FINAL NEGATIVE REPORT   Report Status PENDING   Incomplete   CULTURE, BLOOD (ROUTINE X 2)     Status: Normal (Preliminary result)   Collection Time   05/10/12  1:20 PM      Component Value Range Status Comment   Specimen Description BLOOD LEFT ARM   Final    Special Requests BOTTLES DRAWN AEROBIC AND ANAEROBIC 10CC   Final    Culture  Setup Time 05/10/2012 21:04   Final    Culture     Final    Value:        BLOOD CULTURE RECEIVED NO GROWTH TO DATE CULTURE  WILL BE HELD FOR 5 DAYS BEFORE ISSUING A FINAL NEGATIVE REPORT   Report Status PENDING   Incomplete   CULTURE, EXPECTORATED SPUTUM-ASSESSMENT     Status: Normal   Collection Time   05/11/12 12:23 PM      Component Value Range Status Comment   Specimen Description SPUTUM   Final    Special Requests Normal   Final    Sputum evaluation     Final    Value: THIS SPECIMEN IS ACCEPTABLE. RESPIRATORY CULTURE REPORT TO FOLLOW.   Report Status 05/11/2012 FINAL   Final   CULTURE, RESPIRATORY     Status: Normal (Preliminary result)   Collection Time   05/11/12 12:23 PM      Component Value Range Status Comment   Specimen Description SPUTUM   Final    Special Requests NONE   Final    Gram Stain     Final    Value: MODERATE WBC PRESENT, PREDOMINANTLY PMN     FEW SQUAMOUS EPITHELIAL CELLS PRESENT     FEW GRAM POSITIVE RODS   Culture NORMAL OROPHARYNGEAL FLORA   Final    Report Status PENDING   Incomplete   AFB CULTURE WITH SMEAR     Status: Normal (Preliminary result)   Collection Time   05/11/12  3:40 PM  Component Value Range Status Comment   Specimen Description SPUTUM   Final    Special Requests NONE   Final    ACID FAST SMEAR NO ACID FAST BACILLI SEEN   Final    Culture     Final    Value: CULTURE WILL BE EXAMINED FOR 6 WEEKS BEFORE ISSUING A FINAL REPORT   Report Status PENDING   Incomplete     Medical History: Past Medical History  Diagnosis Date  . Hypertension   . Diabetes mellitus     Medications:  Scheduled:     . feeding supplement  237 mL Oral BID BM  . insulin aspart  0-20 Units Subcutaneous TID WC  . insulin aspart  0-5 Units Subcutaneous QHS  . insulin aspart protamine-insulin aspart  10 Units Subcutaneous Once  . insulin glargine  15 Units Subcutaneous QHS  . metoprolol tartrate  25 mg Oral BID  . piperacillin-tazobactam (ZOSYN)  IV  3.375 g Intravenous Q8H  . vancomycin  1,250 mg Intravenous Q12H  . DISCONTD: insulin glargine  13 Units Subcutaneous QHS    Infusions:   PRN: albuterol, morphine injection Assessment:  41 yo M from home, with necrotizing PNA with pulmonary abscess   Day # 4 Vancomycin 2g x 1 then 1250mg  IV q12h and Zosyn 3.375g IV q8h.   Renal function wnl/stable, WBC down to wnl, AF  Blood cx x 2 NGTD, Sputum cx = Normal flora, AFB negative x 2.   Goal of Therapy:  Vancomycin trough level 15-20 mcg/ml Zosyn per renal function   Plan:  No change to current doses Will check VT tonight  Continue to monitor and adjust doses as necessary  Gwen Her PharmD  [phone] 05/13/2012 11:41 AM

## 2012-05-13 NOTE — Progress Notes (Signed)
[facility]             Progress Note   Subjective: Feels much better today. Still with large amounts of sputum production.  Objective: Vital signs in last 24 hours: Temp:  [98.1 F (36.7 C)-98.7 F (37.1 C)] 98.5 F (36.9 C) (10/13 0615) Pulse Rate:  [86-96] 86  (10/13 0615) Resp:  [18] 18  (10/13 0615) BP: (118-129)/(82-87) 129/85 mmHg (10/13 0615) SpO2:  [97 %-100 %] 97 % (10/13 0615) Weight change:  Last BM Date: 05/11/12  Intake/Output from previous day: 10/12 0701 - 10/13 0700 In: 1180 [P.O.:480; IV Piggyback:700] Out: -      Physical Exam: General: Alert, awake, oriented x3. HEENT: No bruits, no goiter. Heart: Regular rate and rhythm, without murmurs, rubs, gallops. Lungs: Clear to auscultation bilaterally. Abdomen: Soft, nontender, nondistended, positive bowel sounds. Extremities: No clubbing cyanosis or edema with positive pedal pulses. Neuro: Grossly intact, nonfocal.    Lab Results: Basic Metabolic Panel:  Basename 05/12/12 0352 05/11/12 0340  NA 133* 131*  K 3.5 4.0  CL 97 97  CO2 27 21  GLUCOSE 262* 268*  BUN 10 9  CREATININE 0.46* 0.44*  CALCIUM 9.1 9.4  MG -- --  PHOS -- --   CBC:  Basename 05/12/12 0352 05/11/12 0340  WBC 8.1 11.6*  NEUTROABS -- --  HGB 10.9* 11.0*  HCT 32.7* 32.9*  MCV 74.3* 75.1*  PLT 312 296   CBG:  Basename 05/13/12 1143 05/13/12 0816 05/12/12 2249 05/12/12 1621 05/12/12 1157 05/12/12 0736  GLUCAP 286* 230* 317* 227* 207* 215*     Recent Results (from the past 240 hour(s))  CULTURE, BLOOD (ROUTINE X 2)     Status: Normal (Preliminary result)   Collection Time   05/10/12 12:15 PM      Component Value Range Status Comment   Specimen Description BLOOD LEFT ARM   Final    Special Requests BOTTLES DRAWN AEROBIC AND ANAEROBIC 10CC   Final    Culture  Setup Time 05/10/2012 21:04   Final    Culture     Final    Value:        BLOOD CULTURE RECEIVED NO GROWTH TO DATE CULTURE WILL BE HELD FOR 5  DAYS BEFORE ISSUING A FINAL NEGATIVE REPORT   Report Status PENDING   Incomplete   CULTURE, BLOOD (ROUTINE X 2)     Status: Normal (Preliminary result)   Collection Time   05/10/12  1:20 PM      Component Value Range Status Comment   Specimen Description BLOOD LEFT ARM   Final    Special Requests BOTTLES DRAWN AEROBIC AND ANAEROBIC 10CC   Final    Culture  Setup Time 05/10/2012 21:04   Final    Culture     Final    Value:        BLOOD CULTURE RECEIVED NO GROWTH TO DATE CULTURE WILL BE HELD FOR 5 DAYS BEFORE ISSUING A FINAL NEGATIVE REPORT   Report Status PENDING   Incomplete   CULTURE, EXPECTORATED SPUTUM-ASSESSMENT     Status: Normal   Collection Time   05/11/12 12:23 PM      Component Value Range Status Comment   Specimen Description SPUTUM   Final    Special Requests Normal   Final    Sputum evaluation     Final    Value: THIS SPECIMEN IS ACCEPTABLE. RESPIRATORY CULTURE REPORT TO FOLLOW.   Report Status 05/11/2012 FINAL   Final  CULTURE, RESPIRATORY     Status: Normal (Preliminary result)   Collection Time   05/11/12 12:23 PM      Component Value Range Status Comment   Specimen Description SPUTUM   Final    Special Requests NONE   Final    Gram Stain     Final    Value: MODERATE WBC PRESENT, PREDOMINANTLY PMN     FEW SQUAMOUS EPITHELIAL CELLS PRESENT     FEW GRAM POSITIVE RODS   Culture NORMAL OROPHARYNGEAL FLORA   Final    Report Status PENDING   Incomplete   AFB CULTURE WITH SMEAR     Status: Normal (Preliminary result)   Collection Time   05/11/12  3:40 PM      Component Value Range Status Comment   Specimen Description SPUTUM   Final    Special Requests NONE   Final    ACID FAST SMEAR NO ACID FAST BACILLI SEEN   Final    Culture     Final    Value: CULTURE WILL BE EXAMINED FOR 6 WEEKS BEFORE ISSUING A FINAL REPORT   Report Status PENDING   Incomplete     Studies/Results: No results found.  Medications: Scheduled Meds:    . feeding supplement  237 mL Oral  BID BM  . insulin aspart  0-20 Units Subcutaneous TID WC  . insulin aspart  0-5 Units Subcutaneous QHS  . insulin aspart protamine-insulin aspart  10 Units Subcutaneous Once  . insulin glargine  15 Units Subcutaneous QHS  . metoprolol tartrate  25 mg Oral BID  . piperacillin-tazobactam (ZOSYN)  IV  3.375 g Intravenous Q8H  . vancomycin  1,250 mg Intravenous Q12H  . DISCONTD: insulin glargine  13 Units Subcutaneous QHS   Continuous Infusions:  PRN Meds:.albuterol, morphine injection  Assessment/Plan:  Principal Problem:  *Necrotizing pneumonia Active Problems:  Hypertension  Diabetes mellitus    Necrotizing Pneumonia -Agree with current antibiotics (especially vanc as staph PNA is high in the differential). -Need to r/o TB with cavitary lesion, however I think this is unlikely. -RT to induce sputum for AFB x 3. -Needs to remain in airborne isolation until we have 3 negative AFB smears (First AFB smear is negative). -Sputum cx. -Blood Cx. -Strep pneumo/legionella urine antigens ordered. -Leukocytosis resolved. -Has been afebrile since admission.  DM -CBGs remain elevated. -Increase lantus to 18 units.  HTN -Well controlled.   Time spent coordinating care: 25 minutes.   LOS: 3 days   HERNANDEZ ACOSTA,ESTELA [facility] Pager: [phone] 05/13/2012, 11:57 AM

## 2012-05-14 LAB — GLUCOSE, CAPILLARY: Glucose-Capillary: 239 mg/dL — ABNORMAL HIGH (ref 70–99)

## 2012-05-14 LAB — LEGIONELLA ANTIGEN, URINE: Legionella Antigen, Urine: NEGATIVE

## 2012-05-14 LAB — CULTURE, RESPIRATORY W GRAM STAIN

## 2012-05-14 MED ORDER — INSULIN GLARGINE 100 UNIT/ML ~~LOC~~ SOLN
20.0000 [IU] | Freq: Every day | SUBCUTANEOUS | Status: DC
  Administered 2012-05-14: 20 [IU] via SUBCUTANEOUS

## 2012-05-14 MED ORDER — VANCOMYCIN HCL IN DEXTROSE 1-5 GM/200ML-% IV SOLN
1000.0000 mg | Freq: Three times a day (TID) | INTRAVENOUS | Status: DC
  Filled 2012-05-14: qty 200

## 2012-05-14 NOTE — Progress Notes (Signed)
ANTIBIOTIC CONSULT NOTE - Follow up  Pharmacy Consult for Vancomycin/Zosyn  Indication: Necrotizing PNA  No Known Allergies  Patient Measurements: Height: 6\' 1"  (185.4 cm) Weight: 151 lb 0.2 oz (68.Seth kg) IBW/kg (Calculated) : 79.9    Vital Signs: Temp: 98.7 F (37.1 C) (10/14 0629) Temp src: Oral (10/14 0629) BP: 143/97 mmHg (10/14 0629) Pulse Rate: 76  (10/14 0629) Intake/Output from previous day:   Intake/Output from this shift: Total I/O In: 360 [P.O.:360] Out: -   Labs:  Basename 05/12/12 0352  WBC 8.1  HGB 10.9*  PLT 312  LABCREA --  CREATININE 0.46*   Estimated Creatinine Clearance: 115.4 ml/min (by C-G formula based on Cr of 0.46).  Basename 05/13/12 2145  VANCOTROUGH Seth.3*  VANCOPEAK --  Drue Dun --  GENTTROUGH --  GENTPEAK --  GENTRANDOM --  TOBRATROUGH --  TOBRAPEAK --  TOBRARND --  AMIKACINPEAK --  AMIKACINTROU --  AMIKACIN --     Microbiology: Recent Results (from the past 720 hour(s))  CULTURE, BLOOD (ROUTINE X 2)     Status: Normal (Preliminary result)   Collection Time   05/10/12 12:15 PM      Component Value Range Status Comment   Specimen Description BLOOD LEFT ARM   Final    Special Requests BOTTLES DRAWN AEROBIC AND ANAEROBIC 10CC   Final    Culture  Setup Time 05/10/2012 21:04   Final    Culture     Final    Value:        BLOOD CULTURE RECEIVED NO GROWTH TO DATE CULTURE WILL BE HELD FOR Seth DAYS BEFORE ISSUING A FINAL NEGATIVE REPORT   Report Status PENDING   Incomplete   CULTURE, BLOOD (ROUTINE X 2)     Status: Normal (Preliminary result)   Collection Time   05/10/12  1:20 PM      Component Value Range Status Comment   Specimen Description BLOOD LEFT ARM   Final    Special Requests BOTTLES DRAWN AEROBIC AND ANAEROBIC 10CC   Final    Culture  Setup Time 05/10/2012 21:04   Final    Culture     Final    Value:        BLOOD CULTURE RECEIVED NO GROWTH TO DATE CULTURE WILL BE HELD FOR Seth DAYS BEFORE ISSUING A FINAL NEGATIVE REPORT     Report Status PENDING   Incomplete   CULTURE, EXPECTORATED SPUTUM-ASSESSMENT     Status: Normal   Collection Time   05/11/12 12:23 PM      Component Value Range Status Comment   Specimen Description SPUTUM   Final    Special Requests Normal   Final    Sputum evaluation     Final    Value: THIS SPECIMEN IS ACCEPTABLE. RESPIRATORY CULTURE REPORT TO FOLLOW.   Report Status 05/11/2012 FINAL   Final   CULTURE, RESPIRATORY     Status: Normal   Collection Time   05/11/12 12:23 PM      Component Value Range Status Comment   Specimen Description SPUTUM   Final    Special Requests NONE   Final    Gram Stain     Final    Value: MODERATE WBC PRESENT, PREDOMINANTLY PMN     FEW SQUAMOUS EPITHELIAL CELLS PRESENT     FEW GRAM POSITIVE RODS   Culture NORMAL OROPHARYNGEAL FLORA   Final    Report Status 05/14/2012 FINAL   Final   AFB CULTURE WITH SMEAR  Status: Normal (Preliminary result)   Collection Time   05/11/12  3:40 PM      Component Value Range Status Comment   Specimen Description SPUTUM   Final    Special Requests NONE   Final    ACID FAST SMEAR NO ACID FAST BACILLI SEEN   Final    Culture     Final    Value: CULTURE WILL BE EXAMINED FOR 6 WEEKS BEFORE ISSUING A FINAL REPORT   Report Status PENDING   Incomplete   AFB CULTURE WITH SMEAR     Status: Normal (Preliminary result)   Collection Time   05/13/12  7:05 AM      Component Value Range Status Comment   Specimen Description SPUTUM   Final    Special Requests NONE   Final    ACID FAST SMEAR NO ACID FAST BACILLI SEEN   Final    Culture     Final    Value: CULTURE WILL BE EXAMINED FOR 6 WEEKS BEFORE ISSUING A FINAL REPORT   Report Status PENDING   Incomplete   AFB CULTURE WITH SMEAR     Status: Normal (Preliminary result)   Collection Time   05/13/12 11:58 AM      Component Value Range Status Comment   Specimen Description LUNG   Final    Special Requests Normal   Final    ACID FAST SMEAR NO ACID FAST BACILLI SEEN   Final     Culture     Final    Value: CULTURE WILL BE EXAMINED FOR 6 WEEKS BEFORE ISSUING A FINAL REPORT   Report Status PENDING   Incomplete     Medical History: Past Medical History  Diagnosis Date  . Hypertension   . Diabetes mellitus     Medications:  Scheduled:     . feeding supplement  237 mL Oral BID BM  . insulin aspart  0-20 Units Subcutaneous TID WC  . insulin aspart  0-Seth Units Subcutaneous QHS  . insulin aspart protamine-insulin aspart  10 Units Subcutaneous Once  . insulin glargine  18 Units Subcutaneous QHS  . metoprolol tartrate  25 mg Oral BID  . piperacillin-tazobactam (ZOSYN)  IV  3.375 g Intravenous Q8H  . vancomycin  1,250 mg Intravenous Q12H   Infusions:   PRN: albuterol, morphine injection Assessment:  41 yo Cruz from home, with necrotizing PNA with pulmonary abscess   Currently Day # Seth Vancomycin 2g x 1 then 1250mg  IV q12h and Zosyn 3.375g IV q8h.   Renal function wnl/stable, WBC down to wnl, AF  Blood cx x 2 NGTD, Sputum cx = Normal flora, AFB negative x 2.   Vancomycin trough subtherapeutic = Seth.3  Goal of Therapy:  Vancomycin trough level 15-20 mcg/ml Zosyn per renal function   Plan:  Change vancomycin to 1 g IV q 8 hours Continue Zosyn 3.375 g IV q 8 hours (4 hour infusion) Continue to monitor and adjust doses as necessary  [employer], Pharm.D. Clinical Oncology Pharmacist  Pager # [phone]  05/14/2012 1:41 PM

## 2012-05-14 NOTE — Care Management Note (Unsigned)
    Page 1 of 1   05/14/2012     1:53:53 PM   CARE MANAGEMENT NOTE 05/14/2012  Patient:  Seth Cruz, Seth Cruz   Account Number:  [id number]  Date Initiated:  05/14/2012  Documentation initiated by:  Lanier Clam  Subjective/Objective Assessment:   ADMITTED W/SOB.PNA.     Action/Plan:   FROM HOME W/SPOUSE   Anticipated DC Date:  05/15/2012   Anticipated DC Plan:  HOME/SELF CARE      DC Planning Services  CM consult      Choice offered to / List presented to:             Status of service:  In process, will continue to follow Medicare Important Message given?   (If response is "NO", the following Medicare IM given date fields will be blank) Date Medicare IM given:   Date Additional Medicare IM given:    Discharge Disposition:    Per UR Regulation:  Reviewed for med. necessity/level of care/duration of stay  If discussed at Long Length of Stay Meetings, dates discussed:    Comments:  05/14/12 [facility] Aydian Dimmick RN,BSN NCM [phone]

## 2012-05-14 NOTE — Consult Note (Addendum)
[facility]  Total days of antibiotics 6        Day 6 vanco        Day 6 piptazo               Reason for Consult: necrotizing pneumonia/lung abscess    Referring Physician: Philip Aspen  Principal Problem:  *Necrotizing pneumonia Active Problems:  Hypertension  Diabetes mellitus    HPI: Seth Cruz is a 41 y.o. male with poorly controlled diabetes mellitus, HbA1c of 16, and hypertension who presents with worsening shortness of breath and productive cough with foul smelling, black sputum that started on day of admit. He states that 3 wks prior he had dental pain in lower molars primarily. No fever, chills, nightsweats. He reports having left sided pleuretic chest pain. He came into emergency room on 10/09 for evaluation, he was found to be afebrile, normal o2 sats, but white count of 15 and chest x-ray which noted left lower lobe necrotizing pneumonia with CT confirming pulmonary cavitary lesion and abscess. He was started on vancomycin and piptazo, placed on respiratory isolation to rule out TB. His WBC has improved during the last 4 days of treatment. Infectious work up is negative, for blood, sputum. Has 3 AFB smears negative.   Past Medical History  Diagnosis Date  . Hypertension   . Diabetes mellitus     Allergies: No Known Allergies   MEDICATIONS:    . feeding supplement  237 mL Oral BID BM  . insulin aspart  0-20 Units Subcutaneous TID WC  . insulin aspart  0-5 Units Subcutaneous QHS  . insulin aspart protamine-insulin aspart  10 Units Subcutaneous Once  . insulin glargine  20 Units Subcutaneous QHS  . metoprolol tartrate  25 mg Oral BID  . piperacillin-tazobactam (ZOSYN)  IV  3.375 g Intravenous Q8H  . vancomycin  1,000 mg Intravenous Q8H  . DISCONTD: insulin glargine  18 Units Subcutaneous QHS  . DISCONTD: vancomycin  1,250 mg Intravenous Q12H    History  Substance Use Topics  . Smoking status: Current Every Day Smoker -- 0.2  packs/day    Types: Cigarettes  . Smokeless tobacco: Not on file  . Alcohol Use: Yes     once a month   - works as Production designer, theatre/television/film. Previously lived in [place], 1 yr ago. Now in [place] for job, has a live-in girlfriend.  No family history on file.   Review of Systems:  + weak. + SOB, productive cough, pleuretic chest pain with cough, per HPI denies any headaches, vision changes, dysphasia, chest pain, palpitations, wheezing, abdominal pain, hematuria, dysuria, constipation, diarrhea, focal extremity numbness weakness or pain. Marland Kitchen His review of systems otherwise negative.  OBJECTIVE: Temp:  [98.3 F (36.8 C)-98.7 F (37.1 C)] 98.7 F (37.1 C) (10/14 1300) Pulse Rate:  [76-85] 81  (10/14 1300) Resp:  [18] 18  (10/14 1300) BP: (115-143)/(84-97) 115/88 mmHg (10/14 1300) SpO2:  [98 %-100 %] 100 % (10/14 1300)  Physical Exam  Constitutional: He is oriented to person, place, and time. He appears well-developed and well-nourished.  HENT:  Head: Normocephalic and atraumatic.  Eyes: Conjunctivae normal and EOM are normal. PERRLA Neck: Trachea normal. Neck supple. No thyromegaly present.  Cardiovascular: Normal rate, regular rhythm, S1 normal, S2 normal and normal pulses. No g/m/r/ Pulses: Radial pulses are 2+ on the right side, and 2+ on the left side.  Pulmonary/Chest: Effort normal. He has no wheezes. He has no rhonchi. He  has no rales. He exhibits no tenderness. decreased breath sounds on the left posterior with splinting  Abdominal: Soft. Normal appearance and bowel sounds are normal. There is no tenderness. There is no CVA tenderness and negative Murphy's sign.  Ext: no c/c/e Neurological: He is alert and oriented to person, place, and time. He has normal strength. No cranial nerve deficit or sensory deficit.  Skin: Skin is warm and dry. No rash noted. He is not diaphoretic.  Psychiatric: His speech is normal. Cooperative and appropriate     LABS: Results for orders placed  during the hospital encounter of 05/10/12 (from the past 48 hour(s))  GLUCOSE, CAPILLARY     Status: Abnormal   Collection Time   05/12/12  4:21 PM      Component Value Range Comment   Glucose-Capillary 227 (*) 70 - 99 mg/dL    Comment 1 Notify RN     LEGIONELLA ANTIGEN, URINE     Status: Normal   Collection Time   05/12/12  4:28 PM      Component Value Range Comment   Specimen Description URINE, RANDOM      Special Requests NONE      Legionella Antigen, Urine Negative for Legionella pneumophilia serogroup 1      Report Status 05/14/2012 FINAL     STREP PNEUMONIAE URINARY ANTIGEN     Status: Normal   Collection Time   05/12/12  4:28 PM      Component Value Range Comment   Strep Pneumo Urinary Antigen NEGATIVE  NEGATIVE   GLUCOSE, CAPILLARY     Status: Abnormal   Collection Time   05/12/12 10:49 PM      Component Value Range Comment   Glucose-Capillary 317 (*) 70 - 99 mg/dL    Comment 1 Documented in Chart      Comment 2 Notify RN     AFB CULTURE WITH SMEAR     Status: Normal (Preliminary result)   Collection Time   05/13/12  7:05 AM      Component Value Range Comment   Specimen Description SPUTUM      Special Requests NONE      ACID FAST SMEAR NO ACID FAST BACILLI SEEN      Culture        Value: CULTURE WILL BE EXAMINED FOR 6 WEEKS BEFORE ISSUING A FINAL REPORT   Report Status PENDING     GLUCOSE, CAPILLARY     Status: Abnormal   Collection Time   05/13/12  8:16 AM      Component Value Range Comment   Glucose-Capillary 230 (*) 70 - 99 mg/dL    Comment 1 Notify RN     GLUCOSE, CAPILLARY     Status: Abnormal   Collection Time   05/13/12 11:43 AM      Component Value Range Comment   Glucose-Capillary 286 (*) 70 - 99 mg/dL    Comment 1 Notify RN     AFB CULTURE WITH SMEAR     Status: Normal (Preliminary result)   Collection Time   05/13/12 11:58 AM      Component Value Range Comment   Specimen Description LUNG      Special Requests Normal      ACID FAST SMEAR NO  ACID FAST BACILLI SEEN      Culture        Value: CULTURE WILL BE EXAMINED FOR 6 WEEKS BEFORE ISSUING A FINAL REPORT   Report Status PENDING     GLUCOSE,  CAPILLARY     Status: Abnormal   Collection Time   05/13/12  4:53 PM      Component Value Range Comment   Glucose-Capillary 262 (*) 70 - 99 mg/dL    Comment 1 Notify RN     GLUCOSE, CAPILLARY     Status: Abnormal   Collection Time   05/13/12  9:39 PM      Component Value Range Comment   Glucose-Capillary 223 (*) 70 - 99 mg/dL    Comment 1 Notify RN     [place], [place]     Status: Abnormal   Collection Time   05/13/12  9:45 PM      Component Value Range Comment   Vancomycin Tr 5.3 (*) 10.0 - 20.0 ug/mL   GLUCOSE, CAPILLARY     Status: Abnormal   Collection Time   05/14/12  7:32 AM      Component Value Range Comment   Glucose-Capillary 233 (*) 70 - 99 mg/dL    Comment 1 Notify RN     GLUCOSE, CAPILLARY     Status: Abnormal   Collection Time   05/14/12 12:24 PM      Component Value Range Comment   Glucose-Capillary 241 (*) 70 - 99 mg/dL    Comment 1 Notify RN       MICRO: 10/10 blood cx x 2 NGTD 10/10 sputum - NOPF 10/11 AFB negative 10/12 AFB neagtive 10/13 ABD negative Strep pneumo negative Ur legionella negative  IMAGING: 05/10/12 CT CHEST WITHOUT CONTRAST  Technique: Multidetector CT imaging of the chest was performed  following the standard protocol without IV contrast.  Comparison: Plain film of earlier today at 0517 hours.  Findings: Lung windows demonstrate patchy airspace and ground-glass  opacity within the basilar segments of the left lower lobe,  extending into the superior segment. Within the superior segment  laterally, is a cavitary component which measures 2.6 x 2.6 cm on  image 26. No pneumothorax. No communication with the bronchial  tree identified.  Soft tissue windows demonstrate normal heart size. Trace left-  sided pleural fluid. No mediastinal or definite hilar adenopathy,  given  limitations of unenhanced CT.  Limited abdominal imaging demonstrates no significant findings. No  acute osseous abnormality.  IMPRESSION:  1. Left lower lobe airspace and ground-glass opacity, most  consistent with infection. Cavitary component in the superior  segment is most consistent with necrotizing pneumonia. Recommend  radiographic versus CT follow-up to confirm clearing.  2. Trace left pleural effusion.  Assessment/Plan:  41yo Male with poorly controlled DM, HTN with pulmonary abscess responding to vancomycin and piptazo. Cultures negative for staph aureus, not found in sputum nor blood  Pulmonary abscess = likely due to mouth flora from poor dentition/teeth abscess. I believe we would have isolated staph aureus either in gram stain or cultures, so I feel less compelled to recommend vancomycin as part of his therapy.  - he should be treated with IV antibiotics for at least 3 wks, then repeat cxr to decide to change to oral regimen, likely clindamycin.  - please place picc line  - will change his antibiotic regimen from pip/tazo to clindamycin 600mg  IV Q 8hr the day prior to discharge x 21days (including days he has received  piptazo)  - will get follow up appointment in ID clinic to decide transition to oral antibiotics in 3 wks.  - recommend to get panorex to see if patient has dental abscess that need to have teeth pulled during hospitalization  Diabetes,  poorly controlled complicated by peripheral neuropathy= currently on lantus 20 units QHS, but am blood sugars in the 250s, he would benefit from an increased dose of evening lantus to be at am goal.  He previously was on metformin, now likely needs insulin. Would recommend to do diabetes education for insulin needs  Health maintenance= he should receive flu vaccine upon discharge.  Dispo = he doesn't have a PCP. Would set him up with IM or FP residents clinic at [facility], especially since they can provide diabetes  education.  Duke Salvia Drue Second MD MPH [facility] [phone]

## 2012-05-14 NOTE — Progress Notes (Signed)
[facility]             Progress Note   Subjective: Feels much better today.   Objective: Vital signs in last 24 hours: Temp:  [98.3 F (36.8 C)-98.7 F (37.1 C)] 98.7 F (37.1 C) (10/14 1300) Pulse Rate:  [76-85] 81  (10/14 1300) Resp:  [18] 18  (10/14 1300) BP: (115-143)/(84-97) 115/88 mmHg (10/14 1300) SpO2:  [98 %-100 %] 100 % (10/14 1300) Weight change:  Last BM Date: 05/13/12  Intake/Output from previous day:   Total I/O In: 360 [P.O.:360] Out: -    Physical Exam: General: Alert, awake, oriented x3. HEENT: No bruits, no goiter. Heart: Regular rate and rhythm, without murmurs, rubs, gallops. Lungs: Clear to auscultation bilaterally. Abdomen: Soft, nontender, nondistended, positive bowel sounds. Extremities: No clubbing cyanosis or edema with positive pedal pulses. Neuro: Grossly intact, nonfocal.    Lab Results: Basic Metabolic Panel:  Basename 05/12/12 0352  NA 133*  K 3.5  CL 97  CO2 27  GLUCOSE 262*  BUN 10  CREATININE 0.46*  CALCIUM 9.1  MG --  PHOS --   CBC:  Basename 05/12/12 0352  WBC 8.1  NEUTROABS --  HGB 10.9*  HCT 32.7*  MCV 74.3*  PLT 312   CBG:  Basename 05/14/12 1224 05/14/12 0732 05/13/12 2139 05/13/12 1653 05/13/12 1143 05/13/12 0816  GLUCAP 241* 233* 223* 262* 286* 230*     Recent Results (from the past 240 hour(s))  CULTURE, BLOOD (ROUTINE X 2)     Status: Normal (Preliminary result)   Collection Time   05/10/12 12:15 PM      Component Value Range Status Comment   Specimen Description BLOOD LEFT ARM   Final    Special Requests BOTTLES DRAWN AEROBIC AND ANAEROBIC 10CC   Final    Culture  Setup Time 05/10/2012 21:04   Final    Culture     Final    Value:        BLOOD CULTURE RECEIVED NO GROWTH TO DATE CULTURE WILL BE HELD FOR 5 DAYS BEFORE ISSUING A FINAL NEGATIVE REPORT   Report Status PENDING   Incomplete   CULTURE, BLOOD (ROUTINE X 2)     Status: Normal (Preliminary result)   Collection Time   05/10/12  1:20 PM      Component Value Range Status Comment   Specimen Description BLOOD LEFT ARM   Final    Special Requests BOTTLES DRAWN AEROBIC AND ANAEROBIC 10CC   Final    Culture  Setup Time 05/10/2012 21:04   Final    Culture     Final    Value:        BLOOD CULTURE RECEIVED NO GROWTH TO DATE CULTURE WILL BE HELD FOR 5 DAYS BEFORE ISSUING A FINAL NEGATIVE REPORT   Report Status PENDING   Incomplete   CULTURE, EXPECTORATED SPUTUM-ASSESSMENT     Status: Normal   Collection Time   05/11/12 12:23 PM      Component Value Range Status Comment   Specimen Description SPUTUM   Final    Special Requests Normal   Final    Sputum evaluation     Final    Value: THIS SPECIMEN IS ACCEPTABLE. RESPIRATORY CULTURE REPORT TO FOLLOW.   Report Status 05/11/2012 FINAL   Final   CULTURE, RESPIRATORY     Status: Normal   Collection Time   05/11/12 12:23 PM      Component Value Range Status Comment   Specimen Description SPUTUM  Final    Special Requests NONE   Final    Gram Stain     Final    Value: MODERATE WBC PRESENT, PREDOMINANTLY PMN     FEW SQUAMOUS EPITHELIAL CELLS PRESENT     FEW GRAM POSITIVE RODS   Culture NORMAL OROPHARYNGEAL FLORA   Final    Report Status 05/14/2012 FINAL   Final   AFB CULTURE WITH SMEAR     Status: Normal (Preliminary result)   Collection Time   05/11/12  3:40 PM      Component Value Range Status Comment   Specimen Description SPUTUM   Final    Special Requests NONE   Final    ACID FAST SMEAR NO ACID FAST BACILLI SEEN   Final    Culture     Final    Value: CULTURE WILL BE EXAMINED FOR 6 WEEKS BEFORE ISSUING A FINAL REPORT   Report Status PENDING   Incomplete   AFB CULTURE WITH SMEAR     Status: Normal (Preliminary result)   Collection Time   05/13/12  7:05 AM      Component Value Range Status Comment   Specimen Description SPUTUM   Final    Special Requests NONE   Final    ACID FAST SMEAR NO ACID FAST BACILLI SEEN   Final    Culture     Final    Value:  CULTURE WILL BE EXAMINED FOR 6 WEEKS BEFORE ISSUING A FINAL REPORT   Report Status PENDING   Incomplete   AFB CULTURE WITH SMEAR     Status: Normal (Preliminary result)   Collection Time   05/13/12 11:58 AM      Component Value Range Status Comment   Specimen Description LUNG   Final    Special Requests Normal   Final    ACID FAST SMEAR NO ACID FAST BACILLI SEEN   Final    Culture     Final    Value: CULTURE WILL BE EXAMINED FOR 6 WEEKS BEFORE ISSUING A FINAL REPORT   Report Status PENDING   Incomplete     Studies/Results: No results found.  Medications: Scheduled Meds:    . feeding supplement  237 mL Oral BID BM  . insulin aspart  0-20 Units Subcutaneous TID WC  . insulin aspart  0-5 Units Subcutaneous QHS  . insulin aspart protamine-insulin aspart  10 Units Subcutaneous Once  . insulin glargine  18 Units Subcutaneous QHS  . metoprolol tartrate  25 mg Oral BID  . piperacillin-tazobactam (ZOSYN)  IV  3.375 g Intravenous Q8H  . vancomycin  1,000 mg Intravenous Q8H  . DISCONTD: vancomycin  1,250 mg Intravenous Q12H   Continuous Infusions:  PRN Meds:.albuterol, morphine injection  Assessment/Plan:  Principal Problem:  *Necrotizing pneumonia Active Problems:  Hypertension  Diabetes mellitus    Necrotizing Pneumonia -Agree with current antibiotics (especially vanc as staph PNA is high in the differential). -All 3 AFB smears are negative. -Will DC airborne isolation. -All cx data is negative to date. -Leukocytosis resolved. -Has been afebrile since admission. -Have asked ID, Dr. Drue Second for antibiotic recommendation for home (?Bactrim).  DM -CBGs remain elevated, but improved. -Increase lantus to 20 units.  HTN -Well controlled.   Time spent coordinating care: 25 minutes.   LOS: 4 days   HERNANDEZ ACOSTA,Stoy Fenn [facility] Pager: [phone] 05/14/2012, 2:27 PM

## 2012-05-15 LAB — GLUCOSE, CAPILLARY: Glucose-Capillary: 229 mg/dL — ABNORMAL HIGH (ref 70–99)

## 2012-05-15 MED ORDER — METOPROLOL TARTRATE 25 MG PO TABS: 25.0000 mg | ORAL_TABLET | Freq: Two times a day (BID) | ORAL | Status: DC

## 2012-05-15 MED ORDER — DEXTROSE 5 % IV SOLN: 600.0000 mg | Freq: Three times a day (TID) | INTRAVENOUS | Status: DC

## 2012-05-15 MED ORDER — SODIUM CHLORIDE 0.9 % IJ SOLN: 10.0000 mL | INTRAMUSCULAR | Status: DC | PRN

## 2012-05-15 MED ORDER — INSULIN GLARGINE 100 UNIT/ML ~~LOC~~ SOLN: 20.0000 [IU] | Freq: Every day | SUBCUTANEOUS | Status: DC

## 2012-05-15 NOTE — Progress Notes (Signed)
Peripherally Inserted Central Catheter/Midline Placement  The IV Nurse has discussed with the patient and/or persons authorized to consent for the patient, the purpose of this procedure and the potential benefits and risks involved with this procedure.  The benefits include less needle sticks, lab draws from the catheter and patient may be discharged home with the catheter.  Risks include, but not limited to, infection, bleeding, blood clot (thrombus formation), and puncture of an artery; nerve damage and irregular heat beat.  Alternatives to this procedure were also discussed.  PICC/Midline Placement Documentation        Stacie Glaze Horton 05/15/2012, 9:37 AM

## 2012-05-15 NOTE — Discharge Summary (Signed)
Physician Discharge Summary  Patient ID: Seth Cruz MRN: [id number] DOB/AGE: [birth year] 41 y.o.  Admit date: 05/10/2012 Discharge date: 05/15/2012  Primary Care Physician:  No primary provider on file.   Discharge Diagnoses:    Principal Problem:  *Necrotizing pneumonia Active Problems:  Hypertension  Diabetes mellitus      Medication List     As of 05/15/2012 10:03 AM    TAKE these medications         dextrose 5 % SOLN 50 mL with clindamycin 600 MG/4ML SOLN 600 mg   Inject 600 mg into the vein every 8 (eight) hours.      insulin glargine 100 UNIT/ML injection   Commonly known as: LANTUS   Inject 20 Units into the skin at bedtime.      metoprolol tartrate 25 MG tablet   Commonly known as: LOPRESSOR   Take 1 tablet (25 mg total) by mouth 2 (two) times daily.         Disposition and Follow-up:  Will be discharged home today in stable and improved condition. Dr. Drue Second will arrange for follow up at her office in 3 weeks.  Consults:  ID, Dr. Drue Second.   Significant Diagnostic Studies:  CXR 10/10: IMPRESSION:  Constellation of findings most suspicious for left lower lobe  necrotizing pneumonia with pulmonary abscess. Also consider  superinfection of a preexisting pulmonary cavity with surrounding  pneumonia.  Given the surrounding airspace disease, cavitary neoplasm is not  Favored.  CT Scan 10/10: IMPRESSION:  1. Left lower lobe airspace and ground-glass opacity, most  consistent with infection. Cavitary component in the superior  segment is most consistent with necrotizing pneumonia. Recommend  radiographic versus CT follow-up to confirm clearing.  2. Trace left pleural effusion.   Brief H and P: For complete details please refer to admission H and P, but in brief patient is a 41 y.o. male with past medical history of diabetes mellitus and hypertension although he's been noncompliant with medications who has for the last few days had progressively  worsening shortness of breath and cough. This continued to worsen. He describes as the cough is productive with what he says is black sputum. No blood. No wheezing. No chest pain. He came into emergency room today as his symptoms progressed. In the emergency room he was noted to have a white count of 15 with normal oxygen saturations on room air. Most concerning was his chest x-ray which noted left lower lobe necrotizing pneumonia with possible pulmonary cavity and abscess. Hospitalists were called for further evaluation and management.     Hospital Course:  Principal Problem:  *Necrotizing pneumonia Active Problems:  Hypertension  Diabetes mellitus    Necrotizing Pneumonia -Has ruled out for TB with 3 negative AFB smears. -Appreciate ID recommendation on antibiotic management. -PICC has been placed and plan is for 21 days of IV clindamycin, at which time he will follow up with Dr. Drue Second for further antibiotic recommendations. -He has been advised to see a dentist for dental care as the source of this PNA is most likely from mouth flora due to poor dentition. -All cx data has been negative to date. -Afebrile and leukocytosis has resolved.  HTN -Well controlled on metoprolol.  DM -Will need to go home on lantus. -Have advised him to secure PCP follow up as good DM control is essential to healing of his PNA.    Time spent on Discharge: Greater than 30 minutes.  SignedChaya Jan [facility] Pager: [phone] 05/15/2012, 10:03  AM     

## 2012-05-16 LAB — CULTURE, BLOOD (ROUTINE X 2)

## 2012-05-17 ENCOUNTER — Encounter: Payer: Self-pay | Admitting: *Deleted

## 2012-05-17 ENCOUNTER — Encounter: Payer: 59 | Attending: Internal Medicine | Admitting: *Deleted

## 2012-05-17 VITALS — Ht 73.0 in | Wt 162.3 lb

## 2012-05-17 DIAGNOSIS — E119 Type 2 diabetes mellitus without complications: Secondary | ICD-10-CM

## 2012-05-17 DIAGNOSIS — Z713 Dietary counseling and surveillance: Secondary | ICD-10-CM | POA: Insufficient documentation

## 2012-05-17 NOTE — Progress Notes (Signed)
Medical Nutrition Therapy:  Appt start time: 1115 end time:  1300.  Assessment:  Primary concerns today: Diabetes education and insulin instruction on Lantus. He lives with his girlfriend who prepares most of the meals. 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  Patient states he was diagnosed with diabetes 2 years ago when he lived in [place]. He states he has been on Metformin since diagnosis. He also states his weight at diagnosis was 275 pounds which indicates a weight loss of 113 pounds in 2 years.. He has been testing his BG since diagnosis and he states the average BG range has been in the 300-400's along with symptoms of polydipsia and polyuria. He was recently hospitalized with diagnosis of pneumonia and his A1c during that admission was 16.7%.  MEDICATIONS: see list. Current diabetes medication is Lantus @ 20 units at bedtime. He works nights as a Estate agent so bedtime is 9:30 AM   DIETARY INTAKE:  Usual eating pattern includes 3 meals and 0-1 snacks per day.  Everyday foods include variety of all food groups including occasional sweets.  Avoided foods include none stated.    24-hr recall:  B (9 AM): 2-3 eggs, 1 cup grits, 1 biscuit, 2-3 bacon or sausage, water  Snk ( AM): none, asleep  L (3 PM): sandwich, chips, grapes, 12 oz regular soda, oatmeal pie Snk ( PM): none, occasionally a regular soda D (12 AM): brings from home; meat, starch, vegetables, bread, water Snk ( PM): none Beverages: water, regular soda, lemonade  Usual physical activity: walks twice a week, has exercise bike and lifts weights  Estimated energy needs: 2100 calories 235 g carbohydrates 158 g protein 58 g fat  Progress Towards Goal(s):  In progress.   Nutritional Diagnosis:  NB-1.1 Food and nutrition-related knowledge deficit As related to diabetes management.  As evidenced by A1c of 16.7% in October, 2013.    Intervention:  Nutrition counseling and diabetes education  initiated. Discussed basic physiology of diabetes, A1c, Carb Counting and reading food labels, and insulin instruction.  The following learning objectives were met by the patient during this visit:   Insulin Action of Lantus insulin  Reviewed syringe & vial VS pen including # units per syringe,    length of needles, vial VS Pen cartridge and needles  Hygiene and storage  Drawing up single and mixed doses if using vials   Single dose   Mixed dose:   Rotation of Sites  Hypoglycemia- symptoms, causes , treatment choices  Record keeping and MD follow up  Hypoglycemia, causes, symptoms and treatment   Patient demonstrated understanding of insulin administration by return demonstration.  Patient received the following handouts:  Living Well with Diabetes  Carb Counting and Food Label handouts  Meal Plan Card  Insulin Instruction Handout                                        Patient to start on insulin as Rx'd by MD @ 20 units at bedtime. He works nights 7 PM to 7 AM so he plans to take his Lantus at 9:30 AM before he goes to sleep.  Patient will be seen for follow-up as needed. He plans to make follow up appointment with me after he meets with his new PCP, Dr. Barton Fanny. I'd like to suggest a C-Peptide test be done to assess his  insulin production and to determine if he has Type 1 or Type 2 Diabetes. Also recommend he move towards Intensive Insulin Therapy going forward to provide more accurate insulin doses based on his food intake and the ability to correct high BG accurately.

## 2012-06-07 ENCOUNTER — Encounter: Payer: Self-pay | Admitting: Internal Medicine

## 2012-06-07 ENCOUNTER — Ambulatory Visit (INDEPENDENT_AMBULATORY_CARE_PROVIDER_SITE_OTHER): Payer: 59 | Admitting: Internal Medicine

## 2012-06-07 ENCOUNTER — Encounter: Payer: Self-pay | Admitting: *Deleted

## 2012-06-07 ENCOUNTER — Ambulatory Visit
Admission: RE | Admit: 2012-06-07 | Discharge: 2012-06-07 | Disposition: A | Discharge status: Home / Self Care | Payer: 59 | Source: Ambulatory Visit | Attending: Internal Medicine | Admitting: Internal Medicine

## 2012-06-07 VITALS — BP 144/96 | HR 92 | Temp 97.9°F | Wt 167.0 lb

## 2012-06-07 DIAGNOSIS — J852 Abscess of lung without pneumonia: Secondary | ICD-10-CM

## 2012-06-07 MED ORDER — CLINDAMYCIN HCL 300 MG PO CAPS: 300.0000 mg | ORAL_CAPSULE | Freq: Four times a day (QID) | ORAL | Status: DC

## 2012-06-07 NOTE — Progress Notes (Signed)
INFECTIOUS DISEASES CLINIC  RFV: follow up for pulmonary abscess Subjective:    Patient ID: Kevron Patella, male    DOB: 1971-06-16, 41 y.o.   MRN: [id number]  HPIMr Locker is a pleasant 40yo Male recently relocated to [place] who has history of poorly controlled DM, HTN who was hospitalized for necrotizing pneumonia with lung absces thought to be due to poor dentition. He finished IV antibiotics last week. Flushing his picc line every day. Still has occ. Nightsweats and chills. No fever, no n/v/d or productive cough Current Outpatient Prescriptions on File Prior to Visit  Medication Sig Dispense Refill  . insulin glargine (LANTUS) 100 UNIT/ML injection Inject 20 Units into the skin at bedtime.  10 mL  1  . metoprolol tartrate (LOPRESSOR) 25 MG tablet Take 1 tablet (25 mg total) by mouth 2 (two) times daily.  60 tablet  1  . dextrose 5 % SOLN 50 mL with clindamycin 600 MG/4ML SOLN 600 mg Inject 600 mg into the vein every 8 (eight) hours.  1 ampule  0   Active Ambulatory Problems    Diagnosis Date Noted  . Hypertension   . Diabetes mellitus 08/12/2011  . Necrotizing pneumonia 05/10/2012   Resolved Ambulatory Problems    Diagnosis Date Noted  . No Resolved Ambulatory Problems   Past Medical History  Diagnosis Date  . Diabetes mellitus    History   Social History  . Marital Status: Single    Spouse Name: N/A    Number of Children: N/A  . Years of Education: N/A   Social History Main Topics  . Smoking status: Former Smoker -- 0.2 packs/day    Types: Cigarettes    Quit date: 05/05/2012  . Smokeless tobacco: Never Used  . Alcohol Use: Yes     Comment: twice a month a couple of beers  . Drug Use: Yes  . Sexually Active: None   Other Topics Concern  . None   Social History Narrative  . None  family history is not on file.  Review of Systems Review of Systems  Constitutional: Negative for fever, chills, diaphoresis, activity change, appetite change, fatigue and unexpected weight  change.  HENT: Negative for congestion, sore throat, rhinorrhea, sneezing, trouble swallowing and sinus pressure.  Eyes: Negative for photophobia and visual disturbance.  Respiratory: Negative for cough, chest tightness, shortness of breath, wheezing and stridor.  Cardiovascular: Negative for chest pain, palpitations and leg swelling.  Gastrointestinal: Negative for nausea, vomiting, abdominal pain, diarrhea, constipation, blood in stool, abdominal distention and anal bleeding.  Genitourinary: Negative for dysuria, hematuria, flank pain and difficulty urinating.  Musculoskeletal: Negative for myalgias, back pain, joint swelling, arthralgias and gait problem.  Skin: Negative for color change, pallor, rash and wound.  Neurological: Negative for dizziness, tremors, weakness and light-headedness.  Hematological: Negative for adenopathy. Does not bruise/bleed easily.  Psychiatric/Behavioral: Negative for behavioral problems, confusion, sleep disturbance, dysphoric mood, decreased concentration and agitation.       Objective:   Physical Exam BP 144/96  Pulse 92  Temp 97.9 F (36.6 C) (Oral)  Wt 167 lb (75.751 kg) Physical Exam  Constitutional: He is oriented to person, place, and time. He appears well-developed and well-nourished. No distress.  HENT:  Mouth/Throat: Oropharynx is clear and moist. No oropharyngeal exudate.  Cardiovascular: Normal rate, regular rhythm and normal heart sounds. Exam reveals no gallop and no friction rub.  No murmur heard.  Pulmonary/Chest: Effort normal and breath sounds normal. No respiratory distress. He has no wheezes.  Abdominal: Soft. Bowel sounds are normal. He exhibits no distension. There is no tenderness.  Lymphadenopathy:  He has no cervical adenopathy.  Neurological: He is alert and oriented to person, place, and time.  Skin: Skin is warm and dry. No rash noted. No erythema.  Psychiatric: He has a normal mood and affect. His behavior is normal.      Imaging: CXR 06/07/12: Findings: The previously noted abscess in the superior segment left  lower lobe is smaller at this time, currently measuring 1.5 x 1.4  cm. There is still an air-fluid level in this area. There remains  patchy consolidation in the left lower lobe, less than on the prior  studies.  Right lung is clear. Heart size and pulmonary vascularity are  normal. No adenopathy. No bone lesions.  IMPRESSION:  The no new abscess in the superior segment left lower lobe is  smaller. There is less patchy infiltrate in the left lower lobe  compared to recent prior studies,, although some infiltrate does  remain in the left lower lobe. Right lung is clear. No new lesion  identified.      Assessment & Plan:  pulmonar abscess= still appears to have some defect of 1.5 x 1.4 cm, smaller in size than in the past. Will continue with clindamycin 300mg  QID for the next month and repeat cxr at next visit to decide if need to extend therapy  - pcp: victoria rankin, [facility]  [phone]: cell   Pharmacy [facility] on [address]  Need note to return to work

## 2012-06-07 NOTE — Progress Notes (Signed)
RN received verbal order to discontinue the patient's PICC line.  Patient identified with name and date of birth. PICC dressing removed, site unremarkable.  Sutures removed without difficulty.  PICC line removed using sterile procedure @ 0930. PICC length equal to that noted in patient's hospital chart of 43 cm. Sterile petroleum gauze + sterile 4X4 applied to PICC site, pressure applied for 10 minutes and covered with Medipore tape as a pressure dressing. Patient tolerated procedure without complaints.  Patient instructed to limit use of arm for 1 hour. Patient instructed that the pressure dressing should remain in place for 24 hours. Patient verbalized understanding of these instructions.

## 2012-06-11 ENCOUNTER — Ambulatory Visit: Payer: Self-pay

## 2012-06-24 LAB — AFB CULTURE WITH SMEAR (NOT AT ARMC): Acid Fast Smear: NONE SEEN

## 2012-06-25 LAB — AFB CULTURE WITH SMEAR (NOT AT ARMC): Acid Fast Smear: NONE SEEN

## 2012-06-27 LAB — AFB CULTURE WITH SMEAR (NOT AT ARMC): Acid Fast Smear: NONE SEEN

## 2012-07-16 DIAGNOSIS — J852 Abscess of lung without pneumonia: Secondary | ICD-10-CM | POA: Insufficient documentation

## 2012-08-06 ENCOUNTER — Other Ambulatory Visit (HOSPITAL_COMMUNITY): Payer: Self-pay | Admitting: Internal Medicine

## 2014-08-15 ENCOUNTER — Inpatient Hospital Stay (HOSPITAL_COMMUNITY)
Admission: EM | Admit: 2014-08-15 | Discharge: 2014-08-18 | DRG: 638 | Disposition: A | Discharge status: Home / Self Care | Payer: 59 | Attending: Internal Medicine | Admitting: Internal Medicine

## 2014-08-15 ENCOUNTER — Emergency Department (HOSPITAL_COMMUNITY): Payer: 59

## 2014-08-15 ENCOUNTER — Encounter (HOSPITAL_COMMUNITY): Payer: Self-pay

## 2014-08-15 DIAGNOSIS — R0602 Shortness of breath: Secondary | ICD-10-CM

## 2014-08-15 DIAGNOSIS — E860 Dehydration: Secondary | ICD-10-CM | POA: Diagnosis present

## 2014-08-15 DIAGNOSIS — E1010 Type 1 diabetes mellitus with ketoacidosis without coma: Secondary | ICD-10-CM

## 2014-08-15 DIAGNOSIS — N179 Acute kidney failure, unspecified: Secondary | ICD-10-CM | POA: Diagnosis present

## 2014-08-15 DIAGNOSIS — I10 Essential (primary) hypertension: Secondary | ICD-10-CM | POA: Diagnosis present

## 2014-08-15 DIAGNOSIS — E875 Hyperkalemia: Secondary | ICD-10-CM | POA: Diagnosis present

## 2014-08-15 DIAGNOSIS — E1310 Other specified diabetes mellitus with ketoacidosis without coma: Secondary | ICD-10-CM | POA: Diagnosis present

## 2014-08-15 DIAGNOSIS — R112 Nausea with vomiting, unspecified: Secondary | ICD-10-CM | POA: Diagnosis present

## 2014-08-15 DIAGNOSIS — J329 Chronic sinusitis, unspecified: Secondary | ICD-10-CM | POA: Diagnosis present

## 2014-08-15 DIAGNOSIS — Z794 Long term (current) use of insulin: Secondary | ICD-10-CM | POA: Diagnosis not present

## 2014-08-15 DIAGNOSIS — D72829 Elevated white blood cell count, unspecified: Secondary | ICD-10-CM | POA: Diagnosis present

## 2014-08-15 DIAGNOSIS — E1110 Type 2 diabetes mellitus with ketoacidosis without coma: Secondary | ICD-10-CM

## 2014-08-15 DIAGNOSIS — Z87891 Personal history of nicotine dependence: Secondary | ICD-10-CM

## 2014-08-15 DIAGNOSIS — E876 Hypokalemia: Secondary | ICD-10-CM | POA: Diagnosis present

## 2014-08-15 DIAGNOSIS — E119 Type 2 diabetes mellitus without complications: Secondary | ICD-10-CM

## 2014-08-15 DIAGNOSIS — N19 Unspecified kidney failure: Secondary | ICD-10-CM

## 2014-08-15 DIAGNOSIS — E083219 Diabetes mellitus due to underlying condition with mild nonproliferative diabetic retinopathy with macular edema, unspecified eye: Secondary | ICD-10-CM

## 2014-08-15 DIAGNOSIS — E0810 Diabetes mellitus due to underlying condition with ketoacidosis without coma: Secondary | ICD-10-CM

## 2014-08-15 HISTORY — DX: Chronic sinusitis, unspecified: J32.9

## 2014-08-15 HISTORY — DX: Type 2 diabetes mellitus with ketoacidosis without coma: E11.10

## 2014-08-15 LAB — PHOSPHORUS: Phosphorus: 3.2 mg/dL (ref 2.3–4.6)

## 2014-08-15 LAB — CBC WITH DIFFERENTIAL/PLATELET
BASOS ABS: 0 10*3/uL (ref 0.0–0.1)
Basophils Relative: 0 % (ref 0–1)
EOS ABS: 0 10*3/uL (ref 0.0–0.7)
EOS PCT: 0 % (ref 0–5)
HCT: 44.5 % (ref 39.0–52.0)
Hemoglobin: 14.4 g/dL (ref 13.0–17.0)
LYMPHS ABS: 1.8 10*3/uL (ref 0.7–4.0)
Lymphocytes Relative: 10 % — ABNORMAL LOW (ref 12–46)
MCH: 25.7 pg — AB (ref 26.0–34.0)
MCHC: 32.4 g/dL (ref 30.0–36.0)
MCV: 79.3 fL (ref 78.0–100.0)
MONOS PCT: 6 % (ref 3–12)
Monocytes Absolute: 1 10*3/uL (ref 0.1–1.0)
Neutro Abs: 15.5 10*3/uL — ABNORMAL HIGH (ref 1.7–7.7)
Neutrophils Relative %: 84 % — ABNORMAL HIGH (ref 43–77)
PLATELETS: 321 10*3/uL (ref 150–400)
RBC: 5.61 MIL/uL (ref 4.22–5.81)
RDW: 14.9 % (ref 11.5–15.5)
WBC: 18.4 10*3/uL — AB (ref 4.0–10.5)

## 2014-08-15 LAB — MAGNESIUM: Magnesium: 2.3 mg/dL (ref 1.5–2.5)

## 2014-08-15 LAB — COMPREHENSIVE METABOLIC PANEL
ALBUMIN: 4.6 g/dL (ref 3.5–5.2)
ALK PHOS: 133 U/L — AB (ref 39–117)
ALT: 28 U/L (ref 0–53)
AST: 26 U/L (ref 0–37)
BUN: 47 mg/dL — ABNORMAL HIGH (ref 6–23)
CREATININE: 2.55 mg/dL — AB (ref 0.50–1.35)
Calcium: 9.7 mg/dL (ref 8.4–10.5)
Chloride: 93 mEq/L — ABNORMAL LOW (ref 96–112)
GFR calc Af Amer: 33 mL/min — ABNORMAL LOW (ref >90)
GFR calc non Af Amer: 29 mL/min — ABNORMAL LOW (ref >90)
GLUCOSE: 759 mg/dL — AB (ref 70–99)
Potassium: 6.7 mmol/L (ref 3.5–5.1)
Sodium: 133 mmol/L — ABNORMAL LOW (ref 135–145)
TOTAL PROTEIN: 8.5 g/dL — AB (ref 6.0–8.3)
Total Bilirubin: 2.7 mg/dL — ABNORMAL HIGH (ref 0.3–1.2)

## 2014-08-15 LAB — CBG MONITORING, ED
Glucose-Capillary: >600 mg/dL (ref 70–99)
Glucose-Capillary: 531 mg/dL — ABNORMAL HIGH (ref 70–99)

## 2014-08-15 LAB — BLOOD GAS, ARTERIAL
ACID-BASE DEFICIT: 28.6 mmol/L — AB (ref 0.0–2.0)
Bicarbonate: 2.3 mEq/L — ABNORMAL LOW (ref 20.0–24.0)
Drawn by: 331471
O2 Saturation: 96.4 %
PATIENT TEMPERATURE: 98.6
PCO2 ART: 8.6 mmHg — AB (ref 35.0–45.0)
PO2 ART: 140 mmHg — AB (ref 80.0–100.0)
TCO2: 2.3 mmol/L (ref 0–100)
pH, Arterial: 7.065 — CL (ref 7.350–7.450)

## 2014-08-15 LAB — GLUCOSE, CAPILLARY
GLUCOSE-CAPILLARY: 277 mg/dL — AB (ref 70–99)
Glucose-Capillary: 466 mg/dL — ABNORMAL HIGH (ref 70–99)
Glucose-Capillary: 448 mg/dL — ABNORMAL HIGH (ref 70–99)
Glucose-Capillary: 402 mg/dL — ABNORMAL HIGH (ref 70–99)
Glucose-Capillary: 313 mg/dL — ABNORMAL HIGH (ref 70–99)

## 2014-08-15 LAB — CBC
HEMATOCRIT: 40.6 % (ref 39.0–52.0)
Hemoglobin: 13.2 g/dL (ref 13.0–17.0)
MCH: 25.5 pg — ABNORMAL LOW (ref 26.0–34.0)
MCHC: 32.5 g/dL (ref 30.0–36.0)
MCV: 78.5 fL (ref 78.0–100.0)
Platelets: 266 10*3/uL (ref 150–400)
RBC: 5.17 MIL/uL (ref 4.22–5.81)
RDW: 14.8 % (ref 11.5–15.5)
WBC: 19.2 10*3/uL — ABNORMAL HIGH (ref 4.0–10.5)

## 2014-08-15 LAB — URINALYSIS, ROUTINE W REFLEX MICROSCOPIC
BILIRUBIN URINE: NEGATIVE
Glucose, UA: >1000 mg/dL — AB
Ketones, ur: >80 mg/dL — AB
Leukocytes, UA: NEGATIVE
NITRITE: NEGATIVE
Protein, ur: 30 mg/dL — AB
SPECIFIC GRAVITY, URINE: 1.023 (ref 1.005–1.030)
UROBILINOGEN UA: 0.2 mg/dL (ref 0.0–1.0)
pH: 5 (ref 5.0–8.0)

## 2014-08-15 LAB — URINE MICROSCOPIC-ADD ON

## 2014-08-15 LAB — TYPE AND SCREEN
ABO/RH(D): A POS
ANTIBODY SCREEN: NEGATIVE

## 2014-08-15 LAB — LIPASE, BLOOD: LIPASE: 48 U/L (ref 11–59)

## 2014-08-15 LAB — BASIC METABOLIC PANEL
Anion gap: 25 — ABNORMAL HIGH (ref 5–15)
BUN: 47 mg/dL — AB (ref 6–23)
CO2: 6 mmol/L — CL (ref 19–32)
Calcium: 9.1 mg/dL (ref 8.4–10.5)
Chloride: 112 mEq/L (ref 96–112)
Creatinine, Ser: 1.97 mg/dL — ABNORMAL HIGH (ref 0.50–1.35)
GFR calc Af Amer: 46 mL/min — ABNORMAL LOW (ref >90)
GFR, EST NON AFRICAN AMERICAN: 39 mL/min — AB (ref >90)
GLUCOSE: 465 mg/dL — AB (ref 70–99)
Potassium: 4.1 mmol/L (ref 3.5–5.1)
SODIUM: 143 mmol/L (ref 135–145)

## 2014-08-15 LAB — POC OCCULT BLOOD, ED: FECAL OCCULT BLD: NEGATIVE

## 2014-08-15 LAB — I-STAT CG4 LACTIC ACID, ED: LACTIC ACID, VENOUS: 5.71 mmol/L — AB (ref 0.5–2.2)

## 2014-08-15 LAB — ABO/RH: ABO/RH(D): A POS

## 2014-08-15 MED ORDER — SODIUM CHLORIDE 0.9 % IV SOLN: INTRAVENOUS | Status: DC

## 2014-08-15 MED ORDER — SODIUM POLYSTYRENE SULFONATE 15 GM/60ML PO SUSP
30.0000 g | Freq: Once | ORAL | Status: DC
  Filled 2014-08-15: qty 120

## 2014-08-15 MED ORDER — ACETAMINOPHEN 325 MG PO TABS
650.0000 mg | ORAL_TABLET | Freq: Four times a day (QID) | ORAL | Status: DC | PRN
  Administered 2014-08-18: 650 mg via ORAL
  Filled 2014-08-15: qty 2

## 2014-08-15 MED ORDER — DEXTROSE 5 % IV SOLN
500.0000 mg | INTRAVENOUS | Status: DC
  Administered 2014-08-16 – 2014-08-17 (×2): 500 mg via INTRAVENOUS
  Filled 2014-08-15 (×3): qty 500

## 2014-08-15 MED ORDER — INSULIN ASPART 100 UNIT/ML IV SOLN
10.0000 [IU] | Freq: Once | INTRAVENOUS | Status: AC
  Administered 2014-08-15: 10 [IU] via INTRAVENOUS
  Filled 2014-08-15: qty 0.1

## 2014-08-15 MED ORDER — SODIUM CHLORIDE 0.9 % IV SOLN
1000.0000 mL | INTRAVENOUS | Status: DC
  Administered 2014-08-15: 1000 mL via INTRAVENOUS

## 2014-08-15 MED ORDER — POLYETHYLENE GLYCOL 3350 17 G PO PACK: 17.0000 g | PACK | Freq: Every day | ORAL | Status: DC | PRN

## 2014-08-15 MED ORDER — SODIUM CHLORIDE 0.9 % IV SOLN
1000.0000 mL | Freq: Once | INTRAVENOUS | Status: AC
  Administered 2014-08-15: 1000 mL via INTRAVENOUS

## 2014-08-15 MED ORDER — HYDROCODONE-ACETAMINOPHEN 5-325 MG PO TABS: 1.0000 | ORAL_TABLET | ORAL | Status: DC | PRN

## 2014-08-15 MED ORDER — METOPROLOL TARTRATE 25 MG PO TABS
25.0000 mg | ORAL_TABLET | Freq: Two times a day (BID) | ORAL | Status: DC
  Administered 2014-08-16 – 2014-08-18 (×5): 25 mg via ORAL
  Filled 2014-08-15 (×6): qty 1

## 2014-08-15 MED ORDER — ONDANSETRON HCL 4 MG PO TABS: 4.0000 mg | ORAL_TABLET | Freq: Four times a day (QID) | ORAL | Status: DC | PRN

## 2014-08-15 MED ORDER — ALBUTEROL SULFATE (2.5 MG/3ML) 0.083% IN NEBU
10.0000 mg | INHALATION_SOLUTION | Freq: Once | RESPIRATORY_TRACT | Status: DC
  Filled 2014-08-15: qty 12

## 2014-08-15 MED ORDER — ONDANSETRON HCL 4 MG/2ML IJ SOLN
4.0000 mg | Freq: Four times a day (QID) | INTRAMUSCULAR | Status: DC | PRN
  Administered 2014-08-16 – 2014-08-17 (×3): 4 mg via INTRAVENOUS
  Filled 2014-08-15 (×3): qty 2

## 2014-08-15 MED ORDER — ZOLPIDEM TARTRATE 5 MG PO TABS: 5.0000 mg | ORAL_TABLET | Freq: Every evening | ORAL | Status: DC | PRN

## 2014-08-15 MED ORDER — SODIUM CHLORIDE 0.9 % IV SOLN
1.0000 g | Freq: Once | INTRAVENOUS | Status: AC
  Administered 2014-08-15: 1 g via INTRAVENOUS
  Filled 2014-08-15: qty 10

## 2014-08-15 MED ORDER — SODIUM CHLORIDE 0.9 % IV BOLUS (SEPSIS)
1000.0000 mL | Freq: Once | INTRAVENOUS | Status: AC
  Administered 2014-08-15: 1000 mL via INTRAVENOUS

## 2014-08-15 MED ORDER — INSULIN REGULAR HUMAN 100 UNIT/ML IJ SOLN
INTRAMUSCULAR | Status: DC
  Administered 2014-08-15: 5.4 [IU]/h via INTRAVENOUS
  Filled 2014-08-15: qty 2.5

## 2014-08-15 MED ORDER — HEPARIN SODIUM (PORCINE) 5000 UNIT/ML IJ SOLN
5000.0000 [IU] | Freq: Three times a day (TID) | INTRAMUSCULAR | Status: DC
  Administered 2014-08-15 – 2014-08-18 (×8): 5000 [IU] via SUBCUTANEOUS
  Filled 2014-08-15 (×11): qty 1

## 2014-08-15 MED ORDER — DEXTROSE-NACL 5-0.45 % IV SOLN
INTRAVENOUS | Status: DC
  Administered 2014-08-16 (×2): via INTRAVENOUS

## 2014-08-15 MED ORDER — SODIUM CHLORIDE 0.9 % IV SOLN: INTRAVENOUS | Status: AC

## 2014-08-15 MED ORDER — SODIUM BICARBONATE 8.4 % IV SOLN
50.0000 meq | Freq: Once | INTRAVENOUS | Status: AC
  Administered 2014-08-15: 50 meq via INTRAVENOUS
  Filled 2014-08-15: qty 50

## 2014-08-15 MED ORDER — ACETAMINOPHEN 650 MG RE SUPP: 650.0000 mg | Freq: Four times a day (QID) | RECTAL | Status: DC | PRN

## 2014-08-15 MED ORDER — DEXTROSE-NACL 5-0.45 % IV SOLN: INTRAVENOUS | Status: DC

## 2014-08-15 MED ORDER — ONDANSETRON HCL 4 MG/2ML IJ SOLN: 4.0000 mg | Freq: Three times a day (TID) | INTRAMUSCULAR | Status: DC | PRN

## 2014-08-15 MED ORDER — DEXTROSE 5 % IV SOLN
500.0000 mg | Freq: Once | INTRAVENOUS | Status: AC
  Administered 2014-08-15: 500 mg via INTRAVENOUS
  Filled 2014-08-15: qty 500

## 2014-08-15 MED ORDER — SODIUM POLYSTYRENE SULFONATE 15 GM/60ML PO SUSP
30.0000 g | Freq: Once | ORAL | Status: AC
  Administered 2014-08-15: 30 g via ORAL
  Filled 2014-08-15: qty 120

## 2014-08-15 MED ORDER — MORPHINE SULFATE 2 MG/ML IJ SOLN: 2.0000 mg | INTRAMUSCULAR | Status: DC | PRN

## 2014-08-15 MED ORDER — DEXTROSE 5 % IV SOLN
1.0000 g | INTRAVENOUS | Status: DC
  Administered 2014-08-16 – 2014-08-17 (×2): 1 g via INTRAVENOUS
  Filled 2014-08-15 (×3): qty 10

## 2014-08-15 MED ORDER — SODIUM CHLORIDE 0.9 % IV SOLN
INTRAVENOUS | Status: DC
  Administered 2014-08-16: 20:00:00 via INTRAVENOUS

## 2014-08-15 MED ORDER — CEFTRIAXONE SODIUM 1 G IJ SOLR
1.0000 g | Freq: Once | INTRAMUSCULAR | Status: AC
  Administered 2014-08-15: 1 g via INTRAVENOUS
  Filled 2014-08-15: qty 10

## 2014-08-15 MED ORDER — HYDRALAZINE HCL 20 MG/ML IJ SOLN: 10.0000 mg | Freq: Four times a day (QID) | INTRAMUSCULAR | Status: DC | PRN

## 2014-08-15 MED ORDER — SODIUM CHLORIDE 0.9 % IV SOLN
INTRAVENOUS | Status: DC
  Administered 2014-08-15: 21:00:00 via INTRAVENOUS

## 2014-08-15 NOTE — ED Notes (Signed)
Per EMS pt from [facility] and pt is having n and v past 3 weeks. Also has had coughing x 1 week. Pt taking insulin and CBG was 549. EMS did EKG and pt sinus tach. IV left AC 18 gauge.

## 2014-08-15 NOTE — H&P (Signed)
Patient Demographics  Seth Cruz, is a 44 y.o. male  MRN: [id number]   DOB - [birth year]  Admit Date - 08/15/2014  Outpatient Primary MD for the patient is Beverley FiedlerANKINS,VICTORIA, MD     Assessment Seth Cruz is a pleasant 44 year old type 2 diabetic male with essential htn and prior history of lung abscess who comes in with nausea, vomiting, generalized body aches as well as SOB preceded by cold like symptoms ongoing for a couple of weeks and he is found to be in DKA with Acute Kidney injury and hyperkalemia. Sugars were greater than 900mg /dl. Wbc is 18,000, bun/cr- 47/2.55, potassium 6.7, pH-7.06, HCO3<5. UA does not suggest UTI and CXR is unrevealing(patient still dry, hence CXR findings may change with hydration). He is severely dehydrated. Likely, URTI of viral nature in origin possibly got complicated by superimposed bacterial infection causing dehydration and now DKA, despite him continuing to take insulin despite feeling sick. It is not clear if prior lung abscess history has any part to play in the current presentation. Patient has aready received some fluids and is on insulin drip per protocol. He also received sodium bicarb, Ceftriaxone/Zithromax/Calcium gluconate(some peaked T-waves) and kayexalate.. Will admit to SDU, continue DKA protocol, obtain blood cultures, repeat CXR in am, cover with empiric antibiotics, and obtain renal ultrasound. Will keep him NPO for now. His condition is critical. Time spent for critical care 55 minutes. Plan DKA (diabetic ketoacidoses)/Diabetes mellitus  Admit SDU  DKA protocol with IVF/insulin drip/serial BMP.  NPO for now.  Check HbA1C. Acute kidney injury/Hyperkalemia  IVF  Renal ultrasound.  Avoid nephrotoxic meds.  Expect quick resolution. SOB (shortness of breath)/Hx of Lung abscess  ?Kusmal breathing  Repeat CXR in am after hydration  Ceftriaxone/Zithromax. Hypertension  Monitor BP on Lopressor and adjust meds  accordingly. DVT Prophylaxis/Gi Prophylaxis  Heparin   SCDs   PPI  AM Labs Ordered, also please review Full Orders  Family Communication: Admission, patients condition and plan of care including tests being ordered have been discussed with the patient who indicated understanding and agree with the plan and Code Status.  Code Status   Full Code Likely DC to   Home Condition  Critical   Time spent in minutes : 55 minutes.       With History of -  Past Medical History  Diagnosis Date  . Hypertension   . Diabetes mellitus       Past Surgical History  Procedure Laterality Date  . No past surgeries      in for   Chief Complaint  Patient presents with  . Nausea  . Emesis     HPI  Seth Cruz  is a 44 y.o. male, who comes in with nausea, vomiting, sob, cough and generalized body aches preceded by cold like symptoms for a few weeks. He denies diarrhea. No sick contacts. He continued to take Lantus despite not feeling well.    Review of Systems    In addition to the HPI above,  No Fever-chills, No Headache, No changes with Vision or hearing, No problems swallowing food or Liquids, No Chest pain, Cough or Shortness of Breath, No Abdominal pain, No Nausea or Vommitting, Bowel movements are regular, No Blood in stool or Urine, No dysuria, No new skin rashes or bruises, No new joints pains-aches,  No new weakness, tingling, numbness in any extremity, No recent weight gain or loss, No polyuria, polydypsia or polyphagia, No significant Mental Stressors.  A full 10  point Review of Systems was done, except as stated above, all other Review of Systems were negative.   Social History History  Substance Use Topics  . Smoking status: Former Smoker -- 0.25 packs/day    Types: Cigarettes    Quit date: 05/05/2012  . Smokeless tobacco: Never Used  . Alcohol Use: Yes     Comment: twice a month a couple of beers     Family History History reviewed. No  pertinent family history.  Prior to Admission medications   Medication Sig Start Date End Date Taking? Authorizing Provider  guaiFENesin (ROBITUSSIN) 100 MG/5ML liquid Take 200 mg by mouth 3 (three) times daily as needed for cough (cough).   Yes Historical Provider, MD  insulin glargine (LANTUS) 100 UNIT/ML injection Inject 20 Units into the skin at bedtime. 05/15/12  Yes Estela Isaiah Blakes, MD  metoprolol tartrate (LOPRESSOR) 25 MG tablet Take 1 tablet (25 mg total) by mouth 2 (two) times daily. 05/15/12  Yes Estela Isaiah Blakes, MD  clindamycin (CLEOCIN) 300 MG capsule Take 1 capsule (300 mg total) by mouth 4 (four) times daily. Patient not taking: Reported on 08/15/2014 06/07/12   Judyann Munson, MD  dextrose 5 % SOLN 50 mL with clindamycin 600 MG/4ML SOLN 600 mg Inject 600 mg into the vein every 8 (eight) hours. Patient not taking: Reported on 08/15/2014 05/15/12   Henderson Cloud, MD    No Known Allergies  Physical Exam  Vitals  Blood pressure 158/95, pulse 113, temperature 97.3 F (36.3 C), temperature source Oral, resp. rate 28, height 6' (1.829 m), weight 88.8 kg (195 lb 12.3 oz), SpO2 100 %.   1. General lying in bed seems in discomfort.  2. Normal affect and insight, Not Suicidal or Homicidal, Awake Alert, Oriented X 3.  3. No F.N deficits, ALL C.Nerves Intact, Strength 5/5 all 4 extremities, Sensation intact all 4 extremities, Plantars down going.  4. Ears and Eyes appear Normal, Conjunctivae clear, PERRLA. Dry Oral Mucosa.  5. Supple Neck, No JVD, No cervical lymphadenopathy appriciated, No Carotid Bruits.  6. Symmetrical Chest wall movement, Good air movement bilaterally, Mild respiratory distress.  7. RRR, No Gallops, Rubs or Murmurs, No Parasternal Heave. .   8. Positive Bowel Sounds, Abdomen Soft, Non tender, No organomegaly appriciated,No rebound -guarding or rigidity.  9.  No Cyanosis, Normal Skin Turgor, No Skin Rash or Bruise.  10. Good  muscle tone,  joints appear normal , no effusions, Normal ROM.  11. No Palpable Lymph Nodes in Neck or Axillae   Data Review  CBC  Recent Labs Lab 08/15/14 1418  WBC 18.4*  HGB 14.4  HCT 44.5  PLT 321  MCV 79.3  MCH 25.7*  MCHC 32.4  RDW 14.9  LYMPHSABS 1.8  MONOABS 1.0  EOSABS 0.0  BASOSABS 0.0   ------------------------------------------------------------------------------------------------------------------  Chemistries   Recent Labs Lab 08/15/14 1418  NA 133*  K 6.7*  CL 93*  CO2 <5*  GLUCOSE 759*  BUN 47*  CREATININE 2.55*  CALCIUM 9.7  AST 26  ALT 28  ALKPHOS 133*  BILITOT 2.7*   ------------------------------------------------------------------------------------------------------------------ estimated creatinine clearance is 40.2 mL/min (by C-G formula based on Cr of 2.55). ------------------------------------------------------------------------------------------------------------------ No results for input(s): TSH, T4TOTAL, T3FREE, THYROIDAB in the last 72 hours.  Invalid input(s): FREET3   Coagulation profile No results for input(s): INR, PROTIME in the last 168 hours. ------------------------------------------------------------------------------------------------------------------- No results for input(s): DDIMER in the last 72 hours. -------------------------------------------------------------------------------------------------------------------  Cardiac Enzymes No results for  input(s): CKMB, TROPONINI, MYOGLOBIN in the last 168 hours.  Invalid input(s): CK ------------------------------------------------------------------------------------------------------------------ Invalid input(s): POCBNP   ---------------------------------------------------------------------------------------------------------------  Urinalysis    Component Value Date/Time   COLORURINE YELLOW 08/15/2014 1643   APPEARANCEUR CLEAR 08/15/2014 1643   LABSPEC  1.023 08/15/2014 1643   PHURINE 5.0 08/15/2014 1643   GLUCOSEU >1000* 08/15/2014 1643   HGBUR TRACE* 08/15/2014 1643   BILIRUBINUR NEGATIVE 08/15/2014 1643   KETONESUR >80* 08/15/2014 1643   PROTEINUR 30* 08/15/2014 1643   UROBILINOGEN 0.2 08/15/2014 1643   NITRITE NEGATIVE 08/15/2014 1643   LEUKOCYTESUR NEGATIVE 08/15/2014 1643    ----------------------------------------------------------------------------------------------------------------  Imaging results:   Dg Chest Port 1 View  08/15/2014   CLINICAL DATA:  Shortness of breath today, LEFT side chest pain, labored breathing, diabetes, hypertension  EXAM: PORTABLE CHEST - 1 VIEW  COMPARISON:  Portable exam 1546 hr compared to 06/07/2012  FINDINGS: Normal heart size, mediastinal contours and pulmonary vascularity.  Lungs clear.  No pleural effusion or pneumothorax.  Bones unremarkable.  IMPRESSION: No acute abnormalities.   Electronically Signed   By: Ulyses Southward M.D.   On: 08/15/2014 16:00        Kasean Denherder M.D on 08/15/2014 at 7:16 PM  Between 7am to 7pm - Pager - [phone]  After 7pm go to [email or web address] - password [email or web address]  And look for the night coverage person covering me after hours  [facility] Office  [phone]

## 2014-08-15 NOTE — Progress Notes (Signed)
CRITICAL VALUE ALERT  Critical value received:  CO2 6  Date of notification:  08/15/14  Time of notification:  2213  Critical value read back:Yes.    Nurse who received alert:  Albesa SeenLindsay Coulton Schlink RN  MD notified (1st page):  Virgia Land. Calahan NP  Time of first page:  2214  Responding MD: Virgia Land. Calahan NP  Time MD responded:  2215  No further orders at this time.  Barrie LymeVance, Nylani Michetti E RN 10:16 PM  08/15/2014

## 2014-08-15 NOTE — Progress Notes (Signed)
Utilization Review completed.  Mylene Bow RN CM  

## 2014-08-15 NOTE — ED Provider Notes (Signed)
CSN: [id number]     Arrival date & time 08/15/14  1333 History   First MD Initiated Contact with Patient 08/15/14 1516     Chief Complaint  Patient presents with  . Nausea  . Emesis     (Consider location/radiation/quality/duration/timing/severity/associated sxs/prior Treatment) HPI   Patient presents with N/V, hyperglycemia.  He began 4 months ago with cough/cold, which has largely resolved.  This has evolved into N/V x 3-4, developed epigastric pain that is intermittent over the past 2 days.  Emesis is noted to be dark. Has had elevated blood sugars, continues to take his insulin.  Is unable to tolerate any PO.   Has increased work of breathing starting today.  Has never been in DKA before.    PCP Dorothyann Peng.     Past Medical History  Diagnosis Date  . Hypertension   . Diabetes mellitus    History reviewed. No pertinent past surgical history. History reviewed. No pertinent family history. History  Substance Use Topics  . Smoking status: Former Smoker -- 0.25 packs/day    Types: Cigarettes    Quit date: 05/05/2012  . Smokeless tobacco: Never Used  . Alcohol Use: Yes     Comment: twice a month a couple of beers    Review of Systems  All other systems reviewed and are negative.     Allergies  Review of patient's allergies indicates no known allergies.  Home Medications   Prior to Admission medications   Medication Sig Start Date End Date Taking? Authorizing Provider  guaiFENesin (ROBITUSSIN) 100 MG/5ML liquid Take 200 mg by mouth 3 (three) times daily as needed for cough (cough).   Yes Historical Provider, MD  insulin glargine (LANTUS) 100 UNIT/ML injection Inject 20 Units into the skin at bedtime. 05/15/12  Yes Estela Isaiah Blakes, MD  metoprolol tartrate (LOPRESSOR) 25 MG tablet Take 1 tablet (25 mg total) by mouth 2 (two) times daily. 05/15/12  Yes Estela Isaiah Blakes, MD  clindamycin (CLEOCIN) 300 MG capsule Take 1 capsule (300 mg total) by mouth 4  (four) times daily. Patient not taking: Reported on 08/15/2014 06/07/12   Judyann Munson, MD  dextrose 5 % SOLN 50 mL with clindamycin 600 MG/4ML SOLN 600 mg Inject 600 mg into the vein every 8 (eight) hours. Patient not taking: Reported on 08/15/2014 05/15/12   Henderson Cloud, MD   BP 112/76 mmHg  Pulse 84  Temp(Src) 98 F (36.7 C) (Oral)  Resp 22  SpO2 100% Physical Exam  Constitutional: He appears well-developed and well-nourished. No distress.  HENT:  Head: Normocephalic and atraumatic.  Neck: Neck supple.  Cardiovascular: Normal rate, regular rhythm and intact distal pulses.   Pulmonary/Chest: Effort normal and breath sounds normal. Tachypnea noted. No respiratory distress. He has no decreased breath sounds. He has no wheezes. He has no rhonchi. He has no rales.  Abdominal: Soft. He exhibits no distension and no mass. There is no tenderness. There is no rebound and no guarding.  Musculoskeletal: He exhibits no edema.  Neurological: He is alert. He exhibits normal muscle tone.  Skin: He is not diaphoretic.  Nursing note and vitals reviewed.   ED Course  Procedures (including critical care time) Labs Review Labs Reviewed  CBC WITH DIFFERENTIAL - Abnormal; Notable for the following:    WBC 18.4 (*)    MCH 25.7 (*)    Neutrophils Relative % 84 (*)    Neutro Abs 15.5 (*)    Lymphocytes Relative 10 (*)  All other components within normal limits  COMPREHENSIVE METABOLIC PANEL - Abnormal; Notable for the following:    Sodium 133 (*)    Potassium 6.7 (*)    Chloride 93 (*)    CO2 <5 (*)    Glucose, Bld 759 (*)    BUN 47 (*)    Creatinine, Ser 2.55 (*)    Total Protein 8.5 (*)    Alkaline Phosphatase 133 (*)    Total Bilirubin 2.7 (*)    GFR calc non Af Amer 29 (*)    GFR calc Af Amer 33 (*)    All other components within normal limits  BLOOD GAS, ARTERIAL - Abnormal; Notable for the following:    pH, Arterial 7.065 (*)    pCO2 arterial 8.6 (*)    pO2,  Arterial 140.0 (*)    Bicarbonate 2.3 (*)    Acid-base deficit 28.6 (*)    All other components within normal limits  CBG MONITORING, ED - Abnormal; Notable for the following:    Glucose-Capillary >600 (*)    All other components within normal limits  I-STAT CG4 LACTIC ACID, ED - Abnormal; Notable for the following:    Lactic Acid, Venous 5.71 (*)    All other components within normal limits  CBG MONITORING, ED - Abnormal; Notable for the following:    Glucose-Capillary >600 (*)    All other components within normal limits  URINE CULTURE  LIPASE, BLOOD  URINALYSIS, ROUTINE W REFLEX MICROSCOPIC  POC OCCULT BLOOD, ED  TYPE AND SCREEN    Imaging Review Dg Chest Port 1 View  08/15/2014   CLINICAL DATA:  Shortness of breath today, LEFT side chest pain, labored breathing, diabetes, hypertension  EXAM: PORTABLE CHEST - 1 VIEW  COMPARISON:  Portable exam 1546 hr compared to 06/07/2012  FINDINGS: Normal heart size, mediastinal contours and pulmonary vascularity.  Lungs clear.  No pleural effusion or pneumothorax.  Bones unremarkable.  IMPRESSION: No acute abnormalities.   Electronically Signed   By: Ulyses SouthwardMark  Boles M.D.   On: 08/15/2014 16:00     EKG Interpretation None       4:00 PM Discussed patient with Dr Karma GanjaLinker, who will also see the patient.   4:39 PM Admitted to [facility]. Hospitalist requests rocephin, azithromycin given ongoing cough.    MDM   Final diagnoses:  SOB (shortness of breath)  Diabetic ketoacidosis without coma associated with diabetes mellitus due to underlying condition  Renal failure    Pt with hx DM with 4 weeks of cough, 3-4 days of N/V, not tolerating PO, hyperglycemia, found to be in DKA with acute renal failure, leukocytosis, hyperkalemia with peaked T waves treated with calcium gluconate, insulin, bicarb.  Pt on glucose stabilizer.  VS stable.  Admitted to [facility], see above.  Holding orders placed, pt to go to stepdown unit.      Trixie Dredgemily  Detavious Rinn, PA-C 08/15/14 1714  Ethelda ChickMartha K Linker, MD 08/15/14 [phone]

## 2014-08-15 NOTE — ED Notes (Signed)
Admitting MD at bedside.

## 2014-08-15 NOTE — ED Notes (Signed)
CBG HIGH reported to [employer] Martha H. And PA Irving BurtonEmily

## 2014-08-16 ENCOUNTER — Inpatient Hospital Stay (HOSPITAL_COMMUNITY): Payer: 59

## 2014-08-16 DIAGNOSIS — N179 Acute kidney failure, unspecified: Secondary | ICD-10-CM

## 2014-08-16 DIAGNOSIS — E1310 Other specified diabetes mellitus with ketoacidosis without coma: Principal | ICD-10-CM

## 2014-08-16 DIAGNOSIS — I10 Essential (primary) hypertension: Secondary | ICD-10-CM

## 2014-08-16 LAB — HEPATIC FUNCTION PANEL
ALK PHOS: 99 U/L (ref 39–117)
ALT: 19 U/L (ref 0–53)
AST: 15 U/L (ref 0–37)
Albumin: 3.9 g/dL (ref 3.5–5.2)
BILIRUBIN TOTAL: 1 mg/dL (ref 0.3–1.2)
Bilirubin, Direct: <0.1 mg/dL (ref 0.0–0.3)
Total Protein: 7.2 g/dL (ref 6.0–8.3)

## 2014-08-16 LAB — BASIC METABOLIC PANEL
ANION GAP: 18 — AB (ref 5–15)
ANION GAP: 7 (ref 5–15)
Anion gap: 6 (ref 5–15)
BUN: 41 mg/dL — ABNORMAL HIGH (ref 6–23)
BUN: 33 mg/dL — AB (ref 6–23)
BUN: 30 mg/dL — AB (ref 6–23)
CALCIUM: 9.1 mg/dL (ref 8.4–10.5)
CALCIUM: 8.8 mg/dL (ref 8.4–10.5)
CALCIUM: 8.7 mg/dL (ref 8.4–10.5)
CHLORIDE: 114 meq/L — AB (ref 96–112)
CO2: 13 mmol/L — ABNORMAL LOW (ref 19–32)
CO2: 19 mmol/L (ref 19–32)
CO2: 20 mmol/L (ref 19–32)
Chloride: 113 mEq/L — ABNORMAL HIGH (ref 96–112)
Chloride: 115 mEq/L — ABNORMAL HIGH (ref 96–112)
Creatinine, Ser: 1.55 mg/dL — ABNORMAL HIGH (ref 0.50–1.35)
Creatinine, Ser: 1.04 mg/dL (ref 0.50–1.35)
Creatinine, Ser: 0.96 mg/dL (ref 0.50–1.35)
GFR calc Af Amer: 61 mL/min — ABNORMAL LOW (ref >90)
GFR calc non Af Amer: 85 mL/min — ABNORMAL LOW (ref >90)
GFR, EST NON AFRICAN AMERICAN: 52 mL/min — AB (ref >90)
GLUCOSE: 302 mg/dL — AB (ref 70–99)
GLUCOSE: 169 mg/dL — AB (ref 70–99)
GLUCOSE: 118 mg/dL — AB (ref 70–99)
Potassium: 3.6 mmol/L (ref 3.5–5.1)
Potassium: 3.3 mmol/L — ABNORMAL LOW (ref 3.5–5.1)
Potassium: 3.4 mmol/L — ABNORMAL LOW (ref 3.5–5.1)
SODIUM: 140 mmol/L (ref 135–145)
Sodium: 144 mmol/L (ref 135–145)
Sodium: 141 mmol/L (ref 135–145)

## 2014-08-16 LAB — CBC WITH DIFFERENTIAL/PLATELET
BASOS ABS: 0 10*3/uL (ref 0.0–0.1)
Basophils Relative: 0 % (ref 0–1)
EOS ABS: 0 10*3/uL (ref 0.0–0.7)
Eosinophils Relative: 0 % (ref 0–5)
HEMATOCRIT: 36.4 % — AB (ref 39.0–52.0)
Hemoglobin: 12 g/dL — ABNORMAL LOW (ref 13.0–17.0)
Lymphocytes Relative: 9 % — ABNORMAL LOW (ref 12–46)
Lymphs Abs: 1.3 10*3/uL (ref 0.7–4.0)
MCH: 24.8 pg — ABNORMAL LOW (ref 26.0–34.0)
MCHC: 33 g/dL (ref 30.0–36.0)
MCV: 75.4 fL — ABNORMAL LOW (ref 78.0–100.0)
MONOS PCT: 7 % (ref 3–12)
Monocytes Absolute: 1 10*3/uL (ref 0.1–1.0)
Neutro Abs: 11.8 10*3/uL — ABNORMAL HIGH (ref 1.7–7.7)
Neutrophils Relative %: 84 % — ABNORMAL HIGH (ref 43–77)
Platelets: 272 10*3/uL (ref 150–400)
RBC: 4.83 MIL/uL (ref 4.22–5.81)
RDW: 14.6 % (ref 11.5–15.5)
WBC: 14.1 10*3/uL — AB (ref 4.0–10.5)

## 2014-08-16 LAB — GLUCOSE, CAPILLARY
GLUCOSE-CAPILLARY: 102 mg/dL — AB (ref 70–99)
GLUCOSE-CAPILLARY: 115 mg/dL — AB (ref 70–99)
GLUCOSE-CAPILLARY: 117 mg/dL — AB (ref 70–99)
GLUCOSE-CAPILLARY: 208 mg/dL — AB (ref 70–99)
GLUCOSE-CAPILLARY: 340 mg/dL — AB (ref 70–99)
Glucose-Capillary: 275 mg/dL — ABNORMAL HIGH (ref 70–99)
Glucose-Capillary: 211 mg/dL — ABNORMAL HIGH (ref 70–99)
Glucose-Capillary: 190 mg/dL — ABNORMAL HIGH (ref 70–99)
Glucose-Capillary: 182 mg/dL — ABNORMAL HIGH (ref 70–99)
Glucose-Capillary: 150 mg/dL — ABNORMAL HIGH (ref 70–99)
Glucose-Capillary: 124 mg/dL — ABNORMAL HIGH (ref 70–99)
Glucose-Capillary: 112 mg/dL — ABNORMAL HIGH (ref 70–99)
Glucose-Capillary: 193 mg/dL — ABNORMAL HIGH (ref 70–99)
Glucose-Capillary: 344 mg/dL — ABNORMAL HIGH (ref 70–99)
Glucose-Capillary: 360 mg/dL — ABNORMAL HIGH (ref 70–99)
Glucose-Capillary: 326 mg/dL — ABNORMAL HIGH (ref 70–99)

## 2014-08-16 LAB — MAGNESIUM
MAGNESIUM: 2.3 mg/dL (ref 1.5–2.5)
MAGNESIUM: 2.1 mg/dL (ref 1.5–2.5)
Magnesium: 2.2 mg/dL (ref 1.5–2.5)

## 2014-08-16 LAB — MRSA PCR SCREENING: MRSA by PCR: NEGATIVE

## 2014-08-16 LAB — INFLUENZA PANEL BY PCR (TYPE A & B)
H1N1FLUPCR: NOT DETECTED
Influenza A By PCR: NEGATIVE
Influenza B By PCR: NEGATIVE

## 2014-08-16 LAB — TSH: TSH: 0.881 u[IU]/mL (ref 0.350–4.500)

## 2014-08-16 LAB — PHOSPHORUS
PHOSPHORUS: 1.1 mg/dL — AB (ref 2.3–4.6)
Phosphorus: <1 mg/dL — CL (ref 2.3–4.6)
Phosphorus: 1.8 mg/dL — ABNORMAL LOW (ref 2.3–4.6)

## 2014-08-16 LAB — PROTIME-INR
INR: 0.96 (ref 0.00–1.49)
Prothrombin Time: 12.8 seconds (ref 11.6–15.2)

## 2014-08-16 LAB — APTT: APTT: 29 s (ref 24–37)

## 2014-08-16 MED ORDER — INSULIN ASPART 100 UNIT/ML ~~LOC~~ SOLN
0.0000 [IU] | Freq: Three times a day (TID) | SUBCUTANEOUS | Status: DC
  Administered 2014-08-16: 11 [IU] via SUBCUTANEOUS
  Administered 2014-08-16: 3 [IU] via SUBCUTANEOUS
  Administered 2014-08-17 (×3): 5 [IU] via SUBCUTANEOUS

## 2014-08-16 MED ORDER — INSULIN GLARGINE 100 UNIT/ML ~~LOC~~ SOLN
20.0000 [IU] | Freq: Every day | SUBCUTANEOUS | Status: DC
  Administered 2014-08-16: 20 [IU] via SUBCUTANEOUS
  Filled 2014-08-16: qty 0.2

## 2014-08-16 MED ORDER — POTASSIUM CHLORIDE 10 MEQ/100ML IV SOLN
INTRAVENOUS | Status: AC
  Administered 2014-08-16: 10 meq via INTRAVENOUS
  Filled 2014-08-16: qty 100

## 2014-08-16 MED ORDER — POTASSIUM CHLORIDE 10 MEQ/100ML IV SOLN
10.0000 meq | Freq: Once | INTRAVENOUS | Status: AC
  Administered 2014-08-16: 10 meq via INTRAVENOUS

## 2014-08-16 MED ORDER — DEXTROSE 5 % IV SOLN
30.0000 mmol | Freq: Once | INTRAVENOUS | Status: AC
  Administered 2014-08-16: 30 mmol via INTRAVENOUS
  Filled 2014-08-16: qty 10

## 2014-08-16 MED ORDER — INSULIN GLARGINE 100 UNIT/ML ~~LOC~~ SOLN
20.0000 [IU] | Freq: Two times a day (BID) | SUBCUTANEOUS | Status: DC
  Administered 2014-08-16: 20 [IU] via SUBCUTANEOUS
  Filled 2014-08-16 (×2): qty 0.2

## 2014-08-16 MED ORDER — INSULIN ASPART 100 UNIT/ML ~~LOC~~ SOLN: 4.0000 [IU] | Freq: Three times a day (TID) | SUBCUTANEOUS | Status: DC

## 2014-08-16 MED ORDER — POTASSIUM CHLORIDE 10 MEQ/100ML IV SOLN
10.0000 meq | INTRAVENOUS | Status: AC
  Administered 2014-08-16 (×4): 10 meq via INTRAVENOUS
  Filled 2014-08-16 (×4): qty 100

## 2014-08-16 MED ORDER — INSULIN ASPART 100 UNIT/ML ~~LOC~~ SOLN
0.0000 [IU] | Freq: Every day | SUBCUTANEOUS | Status: DC
  Administered 2014-08-16: 4 [IU] via SUBCUTANEOUS

## 2014-08-16 NOTE — Progress Notes (Signed)
CRITICAL VALUE ALERT  Critical value received:  Phosphorus <1.0  Date of notification:  08/16/14  Time of notification:  0451  Critical value read back:Yes.    Nurse who received alert:  Albesa SeenLindsay Lonza Shimabukuro RN  MD notified (1st page):  Benedetto Coons. Callahan NP  Time of first page:  0502  Responding MD:  Benedetto Coons. Callahan NP  Time MD responded:  [phone]  Order for K Phos.  See orders for more details.  Barrie LymeVance, Kacen Mellinger E RN 5:09 AM 08/16/2014

## 2014-08-16 NOTE — Progress Notes (Signed)
Progress Note   Seth Cruz ZOX:[id number] DOB: 19-Aug-1970 DOA: 08/15/2014 PCP: Beverley Fiedler, MD   Brief Narrative:   Seth Cruz is an 44 y.o. male with a PMH of type 2 diabetic male with essential htn and prior history of lung abscess who was admitted 08/15/14 in DKA with acute kidney injury and hyperkalemia. Glucoses were greater than 900 on admission with a WBC of 18, creatinine 2.55, potassium 6.7, pH 7.06, and bicarbonate less than 5.  Assessment/Plan:   Principal Problem:   DKA (diabetic ketoacidoses) / type 2 diabetes  Status post IV fluid bolus in the ER consisting of normal saline x2 liters, continued to vigorously hydrate with normal saline.  Started on insulin drip per glucose stabilizer protocol with every hour CBG checks and every 2 hour BMET checks until stable.  Supplement potassium if needed. Potassium 3.3 this morning so we'll give for runs of IV potassium..  Added dextrose to IV fluids when serum glucose less than 250.  D/C when eating.  Transition to basal/bolus insulin. Continue IV insulin infusion for one to two hours after initiating the SQ insulin, to avoid recurrent hyperglycemia.  CBGs 102-182, anion gap closed at 7, bicarbonate stable at 19.  Follow-up hemoglobin A1c  Diabetes coordinator consultation for diabetes education.  Active Problems:   Hypertension  Continue Lopressor.    Acute kidney injury / dehydration  Creatinine normalized with aggressive IV fluids.    SOB (shortness of breath)  Given history of lung abscess, the patient is currently being treated with ceftriaxone and azithromycin.  Chest x-ray done on admission was clear.  SOB likely from acidosis.  Check influenza panel.    Hyperkalemia/hypokalemia/hypophosphatemia  Received Kayexalate and an amp of sodium bicarbonate in the ED secondary to peaked T waves and a potassium of 6.7. Potassium now slightly low. We'll supplement.  Given phosphorus  supplementation.    DVT Prophylaxis  Continue subcutaneous heparin.  Code Status: Full. Family Communication: No family at the bedside. Disposition Plan: Home when stable.   IV Access:    Peripheral IV   Procedures and diagnostic studies:   Dg Chest Port 1 View 08/15/2014: No acute abnormalities.     Medical Consultants:    None.  Anti-Infectives:    Rocephin 08/15/14--->  Azithromycin 08/15/14--->  Subjective:    [name] has some nausea but no vomiting, no pain, no dyspnea.  URI symptoms with nasal congestion noted. No myalgia or headache.   Hungry.  Objective:    Filed Vitals:   08/16/14 0300 08/16/14 0400 08/16/14 0500 08/16/14 0600  BP:  119/79  113/74  Pulse: 94 95 97 94  Temp:  99 F (37.2 C)    TempSrc:  Axillary    Resp: Height:      Weight:  88.7 kg (195 lb 8.8 oz)    SpO2: 99% 99% 100% 99%    Intake/Output Summary (Last 24 hours) at 08/16/14 0711 Last data filed at 08/16/14 0609  Gross per 24 hour  Intake 1529.78 ml  Output   1350 ml  Net 179.78 ml    Exam: Gen:  NAD Cardiovascular:  RRR, No M/R/G Respiratory:  Lungs CTAB Gastrointestinal:  Abdomen soft, NT/ND, + BS Extremities:  No C/E/C   Data Reviewed:    Labs: Basic Metabolic Panel:  Recent Labs Lab 08/15/14 1418 08/15/14 1852 08/15/14 2252 08/16/14 0252  NA 133* 143 144 141  K 6.7* 4.1 3.6 3.3*  CL 93* 112 113* 115*  CO2 <5* 6* 13* 19  GLUCOSE 759* 465* 302* 169*  BUN 47* 47* 41* 33*  CREATININE 2.55* 1.97* 1.55* 1.04  CALCIUM 9.7 9.1 9.1 8.8  MG  --  2.3 2.3 2.2  PHOS  --  3.2 1.1* <1.0*   GFR Estimated Creatinine Clearance: 98.5 mL/min (by C-G formula based on Cr of 1.04). Liver Function Tests:  Recent Labs Lab 08/15/14 1418 08/16/14 0252  AST 26 15  ALT 28 19  ALKPHOS 133* 99  BILITOT 2.7* 1.0  PROT 8.5* 7.2  ALBUMIN 4.6 3.9    Recent Labs Lab 08/15/14 1557  LIPASE 48   Coagulation profile  Recent Labs Lab  08/16/14 0252  INR 0.96    CBC:  Recent Labs Lab 08/15/14 1418 08/15/14 2025 08/16/14 0252  WBC 18.4* 19.2* 14.1*  NEUTROABS 15.5*  --  11.8*  HGB 14.4 13.2 12.0*  HCT 44.5 40.6 36.4*  MCV 79.3 78.5 75.4*  PLT 321 266 272   CBG:  Recent Labs Lab 08/16/14 0258 08/16/14 0401 08/16/14 0500 08/16/14 0610 08/16/14 0653  GLUCAP 182* 150* 124* 102* 117*   Hgb A1c: No results for input(s): HGBA1C in the last 72 hours.  Sepsis Labs:  Recent Labs Lab 08/15/14 1418 08/15/14 1609 08/15/14 2025 08/16/14 0252  WBC 18.4*  --  19.2* 14.1*  LATICACIDVEN  --  5.71*  --   --    Microbiology Recent Results (from the past 240 hour(s))  MRSA PCR Screening     Status: None   Collection Time: 08/15/14  6:50 PM  Result Value Ref Range Status   MRSA by PCR NEGATIVE NEGATIVE Final    Comment:        The GeneXpert MRSA Assay (FDA approved for NASAL specimens only), is one component of a comprehensive MRSA colonization surveillance program. It is not intended to diagnose MRSA infection nor to guide or monitor treatment for MRSA infections.      Medications:   . azithromycin  500 mg Intravenous Q24H  . cefTRIAXone (ROCEPHIN)  IV  1 g Intravenous Q24H  . heparin  5,000 Units Subcutaneous 3 times per day  . metoprolol tartrate  25 mg Oral BID  . potassium phosphate IVPB (mmol)  30 mmol Intravenous Once   Continuous Infusions: . sodium chloride Stopped (08/16/14 0012)  . sodium chloride 10 mL/hr at 08/16/14 0012  . dextrose 5 % and 0.45% NaCl 100 mL/hr at 08/16/14 0011  . insulin (NOVOLIN-R) infusion 4 Units/hr (08/16/14 0659)    Time spent: 35 minutes.  The patient is medically complex and requires high complexity decision making.    LOS: 1 day   RAMA,CHRISTINA  [facility] Pager [phone]. If unable to reach me by pager, please call my cell phone at [phone].  *Please refer to [email or web address], password [email or web address] to get updated schedule on who will round on this  patient, as hospitalists switch teams weekly. If 7PM-7AM, please contact night-coverage at [email or web address], password [email or web address] for any overnight needs.  08/16/2014, 7:11 AM

## 2014-08-17 ENCOUNTER — Encounter (HOSPITAL_COMMUNITY): Payer: Self-pay | Admitting: Radiology

## 2014-08-17 ENCOUNTER — Inpatient Hospital Stay (HOSPITAL_COMMUNITY): Payer: 59

## 2014-08-17 DIAGNOSIS — D72829 Elevated white blood cell count, unspecified: Secondary | ICD-10-CM | POA: Diagnosis present

## 2014-08-17 DIAGNOSIS — R112 Nausea with vomiting, unspecified: Secondary | ICD-10-CM

## 2014-08-17 LAB — GLUCOSE, CAPILLARY
GLUCOSE-CAPILLARY: 232 mg/dL — AB (ref 70–99)
GLUCOSE-CAPILLARY: 290 mg/dL — AB (ref 70–99)
Glucose-Capillary: 244 mg/dL — ABNORMAL HIGH (ref 70–99)
Glucose-Capillary: 201 mg/dL — ABNORMAL HIGH (ref 70–99)
Glucose-Capillary: 151 mg/dL — ABNORMAL HIGH (ref 70–99)

## 2014-08-17 LAB — HEMOGLOBIN A1C
Hgb A1c MFr Bld: 12.9 % — ABNORMAL HIGH (ref <5.7)
Mean Plasma Glucose: 324 mg/dL — ABNORMAL HIGH (ref <117)

## 2014-08-17 LAB — CBC
HCT: 33.2 % — ABNORMAL LOW (ref 39.0–52.0)
HEMOGLOBIN: 10.9 g/dL — AB (ref 13.0–17.0)
MCH: 24.9 pg — AB (ref 26.0–34.0)
MCHC: 32.8 g/dL (ref 30.0–36.0)
MCV: 75.8 fL — AB (ref 78.0–100.0)
PLATELETS: 220 10*3/uL (ref 150–400)
RBC: 4.38 MIL/uL (ref 4.22–5.81)
RDW: 15.3 % (ref 11.5–15.5)
WBC: 9.6 10*3/uL (ref 4.0–10.5)

## 2014-08-17 LAB — BASIC METABOLIC PANEL
Anion gap: 14 (ref 5–15)
BUN: 18 mg/dL (ref 6–23)
CHLORIDE: 107 meq/L (ref 96–112)
CO2: 20 mmol/L (ref 19–32)
Calcium: 8.9 mg/dL (ref 8.4–10.5)
Creatinine, Ser: 0.73 mg/dL (ref 0.50–1.35)
GFR calc Af Amer: >90 mL/min (ref >90)
GFR calc non Af Amer: >90 mL/min (ref >90)
Glucose, Bld: 280 mg/dL — ABNORMAL HIGH (ref 70–99)
Potassium: 3.8 mmol/L (ref 3.5–5.1)
Sodium: 141 mmol/L (ref 135–145)

## 2014-08-17 LAB — URINE CULTURE: Colony Count: 1000

## 2014-08-17 LAB — PHOSPHORUS: Phosphorus: 1.8 mg/dL — ABNORMAL LOW (ref 2.3–4.6)

## 2014-08-17 LAB — MAGNESIUM: Magnesium: 2.2 mg/dL (ref 1.5–2.5)

## 2014-08-17 MED ORDER — METOCLOPRAMIDE HCL 5 MG/ML IJ SOLN
5.0000 mg | Freq: Four times a day (QID) | INTRAMUSCULAR | Status: AC
  Administered 2014-08-17 – 2014-08-18 (×4): 5 mg via INTRAVENOUS
  Filled 2014-08-17 (×3): qty 2

## 2014-08-17 MED ORDER — INSULIN ASPART 100 UNIT/ML ~~LOC~~ SOLN
6.0000 [IU] | Freq: Three times a day (TID) | SUBCUTANEOUS | Status: DC
  Administered 2014-08-17 (×2): 6 [IU] via SUBCUTANEOUS

## 2014-08-17 MED ORDER — POTASSIUM PHOSPHATES 15 MMOLE/5ML IV SOLN
30.0000 mmol | Freq: Once | INTRAVENOUS | Status: AC
  Administered 2014-08-17: 30 mmol via INTRAVENOUS
  Filled 2014-08-17: qty 10

## 2014-08-17 MED ORDER — LIP MEDEX EX OINT: TOPICAL_OINTMENT | CUTANEOUS | Status: DC | PRN

## 2014-08-17 MED ORDER — INSULIN GLARGINE 100 UNIT/ML ~~LOC~~ SOLN
25.0000 [IU] | Freq: Two times a day (BID) | SUBCUTANEOUS | Status: DC
  Administered 2014-08-17 – 2014-08-18 (×3): 25 [IU] via SUBCUTANEOUS
  Filled 2014-08-17 (×3): qty 0.25

## 2014-08-17 NOTE — Progress Notes (Signed)
Progress Note   Tunis Gentle NWG:[id number] DOB: Dec 17, 1970 DOA: 08/15/2014 PCP: Beverley Fiedler, MD   Brief Narrative:   Seth Cruz is an 44 y.o. male with a PMH of type 2 diabetic male with essential htn and prior history of lung abscess who was admitted 08/15/14 in DKA with acute kidney injury and hyperkalemia. Glucoses were greater than 900 on admission with a WBC of 18, creatinine 2.55, potassium 6.7, pH 7.06, and bicarbonate less than 5.  Assessment/Plan:   Principal Problem:   DKA (diabetic ketoacidoses) / type 2 diabetes  Status post IV fluid bolus in the ER, insulin drip per glucose stabilizer protocol with every hour CBG checks and every 2 hour BMET checks until stable.  Transitioed  to basal/bolus insulin 08/16/14.   Currently on moderate scale SSI with 4 units of meal coverage and 20 units of Lantus every 12 hours. CBGs 244-360. Increase meal coverage to 6 units and Lantus to 25 units every 12 hours.  Follow-up hemoglobin A1c  Diabetes coordinator consultation for diabetes education.  Active Problems:   Nausea and vomiting  Reglan 5 mg Q 6 hours x 24 hours.    Hypertension  Continue Lopressor.    Acute kidney injury / dehydration  Creatinine normalized with aggressive IV fluids.    SOB (shortness of breath)/leukocytosis  Given history of lung abscess/necrotizing pneumonia, the patient is currently being treated with ceftriaxone and azithromycin.  Chest x-ray done on admission was clear.  SOB likely from acidosis.  Influenza panel negative. Urine culture negative. WBC normalized.  Check CT sinuses given purulent post nasal drainage.    Hyperkalemia/hypokalemia/hypophosphatemia  Continue to monitor electrolytes and supplement. We'll give K-Phos today.    DVT Prophylaxis  Continue subcutaneous heparin.  Code Status: Full. Family Communication: No family at the bedside. Disposition Plan: Home when stable.   IV Access:    Peripheral  IV   Procedures and diagnostic studies:   Dg Chest Port 1 View 08/15/2014: No acute abnormalities.     Medical Consultants:    None.  Anti-Infectives:    Rocephin 08/15/14--->  Azithromycin 08/15/14--->  Subjective:   Seth Cruz has had some nausea and vomiting, no pain, no dyspnea.  URI symptoms with nasal congestion and purulent drainage noted.   Objective:    Filed Vitals:   08/16/14 2213 08/17/14 0205 08/17/14 0252 08/17/14 0518  BP: 134/91 150/94  150/94  Pulse: 94 83  88  Temp: 98.2 F (36.8 C) 98 F (36.7 C)  97.4 F (36.3 C)  TempSrc: Oral Oral  Oral  Resp: 20   20  Height:      Weight:   90.2 kg (198 lb 13.7 oz)   SpO2: 100% 99%  100%    Intake/Output Summary (Last 24 hours) at 08/17/14 5784 Last data filed at 08/17/14 0520  Gross per 24 hour  Intake   1400 ml  Output   1425 ml  Net    -25 ml    Exam: Gen:  NAD Cardiovascular:  RRR, No M/R/G Respiratory:  Lungs diminished Gastrointestinal:  Abdomen soft, NT/ND, + BS Extremities:  No C/E/C   Data Reviewed:    Labs: Basic Metabolic Panel:  Recent Labs Lab 08/15/14 1852 08/15/14 2252 08/16/14 0252 08/16/14 0708 08/17/14 0600  NA 143 144 141 140 141  K 4.1 3.6 3.3* 3.4* 3.8  CL 112 113* 115* 114* 107  CO2 6* 13* GLUCOSE 465* 302* 169* 118* 280*  BUN 47* 41*  33* 30* 18  CREATININE 1.97* 1.55* 1.04 0.96 0.73  CALCIUM 9.1 9.1 8.8 8.7 8.9  MG 2.3 2.3 2.2 2.1 2.2  PHOS 3.2 1.1* <1.0* 1.8* 1.8*   GFR Estimated Creatinine Clearance: 128 mL/min (by C-G formula based on Cr of 0.73). Liver Function Tests:  Recent Labs Lab 08/15/14 1418 08/16/14 0252  AST 26 15  ALT 28 19  ALKPHOS 133* 99  BILITOT 2.7* 1.0  PROT 8.5* 7.2  ALBUMIN 4.6 3.9    Recent Labs Lab 08/15/14 1557  LIPASE 48   Coagulation profile  Recent Labs Lab 08/16/14 0252  INR 0.96    CBC:  Recent Labs Lab 08/15/14 1418 08/15/14 2025 08/16/14 0252 08/17/14 0600  WBC 18.4* 19.2* 14.1*  9.6  NEUTROABS 15.5*  --  11.8*  --   HGB 14.4 13.2 12.0* 10.9*  HCT 44.5 40.6 36.4* 33.2*  MCV 79.3 78.5 75.4* 75.8*  PLT 321 266 272 220   CBG:  Recent Labs Lab 08/16/14 1622 08/16/14 1851 08/16/14 2019 08/17/14 0005 08/17/14 0740  GLUCAP 344* 360* 326* 290* 244*   Hgb A1c: No results for input(s): HGBA1C in the last 72 hours.  Sepsis Labs:  Recent Labs Lab 08/15/14 1418 08/15/14 1609 08/15/14 2025 08/16/14 0252 08/17/14 0600  WBC 18.4*  --  19.2* 14.1* 9.6  LATICACIDVEN  --  5.71*  --   --   --    Microbiology Recent Results (from the past 240 hour(s))  Culture, Urine     Status: None   Collection Time: 08/15/14  4:43 PM  Result Value Ref Range Status   Specimen Description URINE, CLEAN CATCH  Final   Special Requests NONE  Final   Colony Count   Final    1,000 COLONIES/ML Performed at [facility]    Culture   Final    INSIGNIFICANT GROWTH Performed at [facility]    Report Status 08/17/2014 FINAL  Final  MRSA PCR Screening     Status: None   Collection Time: 08/15/14  6:50 PM  Result Value Ref Range Status   MRSA by PCR NEGATIVE NEGATIVE Final    Comment:        The GeneXpert MRSA Assay (FDA approved for NASAL specimens only), is one component of a comprehensive MRSA colonization surveillance program. It is not intended to diagnose MRSA infection nor to guide or monitor treatment for MRSA infections.      Medications:   . azithromycin  500 mg Intravenous Q24H  . cefTRIAXone (ROCEPHIN)  IV  1 g Intravenous Q24H  . heparin  5,000 Units Subcutaneous 3 times per day  . insulin aspart  0-15 Units Subcutaneous TID WC  . insulin aspart  0-5 Units Subcutaneous QHS  . insulin aspart  4 Units Subcutaneous TID WC  . insulin glargine  20 Units Subcutaneous BID  . metoprolol tartrate  25 mg Oral BID   Continuous Infusions: . sodium chloride 150 mL/hr at 08/16/14 2018  . sodium chloride 10 mL/hr at 08/16/14 0012  . dextrose 5 %  and 0.45% NaCl 100 mL/hr at 08/16/14 1049    Time spent: 25 minutes.    LOS: 2 days   RAMA,CHRISTINA  [facility] Pager [phone]. If unable to reach me by pager, please call my cell phone at [phone].  *Please refer to [email or web address], password [email or web address] to get updated schedule on who will round on this patient, as hospitalists switch teams weekly. If 7PM-7AM, please contact night-coverage at [email or web address], password [email or web address]  for any overnight needs.  08/17/2014, 8:12 AM

## 2014-08-18 ENCOUNTER — Encounter (HOSPITAL_COMMUNITY): Payer: Self-pay | Admitting: Internal Medicine

## 2014-08-18 DIAGNOSIS — D72829 Elevated white blood cell count, unspecified: Secondary | ICD-10-CM

## 2014-08-18 DIAGNOSIS — E08321 Diabetes mellitus due to underlying condition with mild nonproliferative diabetic retinopathy with macular edema: Secondary | ICD-10-CM

## 2014-08-18 DIAGNOSIS — J329 Chronic sinusitis, unspecified: Secondary | ICD-10-CM | POA: Diagnosis present

## 2014-08-18 DIAGNOSIS — J0140 Acute pansinusitis, unspecified: Secondary | ICD-10-CM

## 2014-08-18 HISTORY — DX: Chronic sinusitis, unspecified: J32.9

## 2014-08-18 LAB — BASIC METABOLIC PANEL
Anion gap: 5 (ref 5–15)
BUN: 12 mg/dL (ref 6–23)
CHLORIDE: 107 meq/L (ref 96–112)
CO2: 27 mmol/L (ref 19–32)
Calcium: 8.8 mg/dL (ref 8.4–10.5)
Creatinine, Ser: 0.69 mg/dL (ref 0.50–1.35)
GFR calc Af Amer: >90 mL/min (ref >90)
GFR calc non Af Amer: >90 mL/min (ref >90)
GLUCOSE: 127 mg/dL — AB (ref 70–99)
POTASSIUM: 3 mmol/L — AB (ref 3.5–5.1)
Sodium: 139 mmol/L (ref 135–145)

## 2014-08-18 LAB — GLUCOSE, CAPILLARY
GLUCOSE-CAPILLARY: 105 mg/dL — AB (ref 70–99)
Glucose-Capillary: 107 mg/dL — ABNORMAL HIGH (ref 70–99)

## 2014-08-18 LAB — PHOSPHORUS: Phosphorus: 2.1 mg/dL — ABNORMAL LOW (ref 2.3–4.6)

## 2014-08-18 MED ORDER — ONDANSETRON HCL 4 MG PO TABS: 4.0000 mg | ORAL_TABLET | Freq: Four times a day (QID) | ORAL | Status: DC | PRN

## 2014-08-18 MED ORDER — INSULIN ASPART 100 UNIT/ML ~~LOC~~ SOLN: 6.0000 [IU] | Freq: Three times a day (TID) | SUBCUTANEOUS | Status: DC

## 2014-08-18 MED ORDER — INSULIN GLARGINE 100 UNIT/ML ~~LOC~~ SOLN
55.0000 [IU] | Freq: Every day | SUBCUTANEOUS | Status: DC
Start: 2014-08-18 — End: 2015-03-04

## 2014-08-18 MED ORDER — LEVOFLOXACIN 750 MG PO TABS
750.0000 mg | ORAL_TABLET | Freq: Once | ORAL | Status: AC
  Administered 2014-08-18: 750 mg via ORAL
  Filled 2014-08-18: qty 1

## 2014-08-18 MED ORDER — LEVOFLOXACIN 750 MG PO TABS: 750.0000 mg | ORAL_TABLET | Freq: Every day | ORAL | Status: DC

## 2014-08-18 MED ORDER — PHENOL 1.4 % MT LIQD
1.0000 | OROMUCOSAL | Status: DC | PRN
Start: 2014-08-18 — End: 2014-08-18
  Administered 2014-08-18: 1 via OROMUCOSAL
  Filled 2014-08-18: qty 177

## 2014-08-18 MED ORDER — POTASSIUM CHLORIDE CRYS ER 20 MEQ PO TBCR
40.0000 meq | EXTENDED_RELEASE_TABLET | Freq: Once | ORAL | Status: AC
  Administered 2014-08-18: 40 meq via ORAL
  Filled 2014-08-18: qty 2

## 2014-08-18 NOTE — Discharge Summary (Signed)
Physician Discharge Summary  Seth Cruz:[id number] DOB: 1970/09/05 DOA: 08/15/2014  PCP: [name], MD  Admit date: 08/15/2014 Discharge date: 08/18/2014   Recommendations for Outpatient Follow-Up:   1. Recommend close outpatient follow-up of diabetes control. Consider adding sliding scale to his regimen. 2. Recommend rechecking electrolytes at next visit to ensure his potassium and phosphorus have normalized. 3. Follow-up final blood culture results. Negative to date.   Discharge Diagnosis:   Principal Problem:    DKA (diabetic ketoacidoses) Active Problems:    Hypertension    Diabetes mellitus    Acute kidney injury    SOB (shortness of breath)    Hyperkalemia    Leukocytosis    Nausea and vomiting    Sinusitis   Discharge Condition: Improved.  Diet recommendation: Carbohydrate-modified.     History of Present Illness:   Seth Cruz is an 44 y.o. male with a PMH of type 2 diabetic male with essential htn and prior history of lung abscess who was admitted 08/15/14 in DKA with acute kidney injury and hyperkalemia. Glucoses were greater than 900 on admission with a WBC of 18, creatinine 2.55, potassium 6.7, pH 7.06, and bicarbonate less than 5.  Hospital Course by Problem:   Principal Problem:  DKA (diabetic ketoacidoses) / type 2 diabetes  Status post IV fluid bolus in the ER, insulin drip per glucose stabilizer protocol with every hour CBG checks and every 2 hour BMET checks until stable.  Transitioed to basal/bolus insulin 08/16/14.   Discharge home on Lantus 55 units daily and 6 units of NovoLog before each meal.  Hemoglobin A1c 12.9%  Counseled by diabetes coordinator.  Active Problems:  Nausea and vomiting  Resolved with Reglan 5 mg Q 6 hours x 24 hours.   Hypertension  Continue Lopressor.   Acute kidney injury / dehydration  Creatinine normalized with aggressive IV fluids.   SOB (shortness of  breath)/leukocytosis  Given history of lung abscess/necrotizing pneumonia, the patient was initially empirically treated with ceftriaxone and azithromycin.  Chest x-ray done on admission was clear. SOB felt to be from acidosis, as it resolved with resolution of his acidosis.  Influenza panel negative. Urine culture negative. WBC normalized.  Bilateral sinus disease noted on CT, therefore patient discharged on a seven-day course of Levaquin.   Hyperkalemia/hypokalemia/hypophosphatemia  Supplemented.    Medical Consultants:    None.   Discharge Exam:   Filed Vitals:   08/18/14 1120  BP: 137/83  Pulse: 86  Temp: 98.1 F (36.7 C)  Resp: 18   Filed Vitals:   08/18/14 0156 08/18/14 0615 08/18/14 0757 08/18/14 1120  BP: 143/88 130/85 148/97 137/83  Pulse: 78 81 81 86  Temp: 98 F (36.7 C) 98 F (36.7 C) 98.2 F (36.8 C) 98.1 F (36.7 C)  TempSrc: Oral Oral Oral Oral  Resp: 20 16 18 18   Height:      Weight:  90.538 kg (199 lb 9.6 oz)    SpO2: 100% 100% 100% 100%    Gen:  NAD Cardiovascular:  RRR, No M/R/G Respiratory: Lungs CTAB Gastrointestinal: Abdomen soft, NT/ND with normal active bowel sounds. Extremities: No C/E/C   The results of significant diagnostics from this hospitalization (including imaging, microbiology, ancillary and laboratory) are listed below for reference.     Procedures and Diagnostic Studies:   Dg Chest Port 1 View 08/15/2014: No acute abnormalities.   Ct Maxillofacial Ltd Wo Cm 08/17/2014: Bilateral sinus disease.     Labs:   Basic Metabolic Panel:  Recent Labs Lab 08/15/14 1852 08/15/14 2252 08/16/14 0252 08/16/14 0708 08/17/14 0600 08/18/14 0545  NA 143 144 141 140 141 139  K 4.1 3.6 3.3* 3.4* 3.8 3.0*  CL 112 113* 115* 114* 107 107  CO2 6* 13* GLUCOSE 465* 302* 169* 118* 280* 127*  BUN 47* 41* 33* 30* 18 12  CREATININE 1.97* 1.55* 1.04 0.96 0.73 0.69  CALCIUM 9.1 9.1 8.8 8.7 8.9 8.8  MG 2.3 2.3 2.2  2.1 2.2  --   PHOS 3.2 1.1* <1.0* 1.8* 1.8* 2.1*   GFR Estimated Creatinine Clearance: 128 mL/min (by C-G formula based on Cr of 0.69). Liver Function Tests:  Recent Labs Lab 08/15/14 1418 08/16/14 0252  AST 26 15  ALT 28 19  ALKPHOS 133* 99  BILITOT 2.7* 1.0  PROT 8.5* 7.2  ALBUMIN 4.6 3.9    Recent Labs Lab 08/15/14 1557  LIPASE 48   Coagulation profile  Recent Labs Lab 08/16/14 0252  INR 0.96    CBC:  Recent Labs Lab 08/15/14 1418 08/15/14 2025 08/16/14 0252 08/17/14 0600  WBC 18.4* 19.2* 14.1* 9.6  NEUTROABS 15.5*  --  11.8*  --   HGB 14.4 13.2 12.0* 10.9*  HCT 44.5 40.6 36.4* 33.2*  MCV 79.3 78.5 75.4* 75.8*  PLT 321 266 272 220   CBG:  Recent Labs Lab 08/17/14 1143 08/17/14 1624 08/17/14 2135 08/18/14 0725 08/18/14 1127  GLUCAP 232* 201* 151* 107* 105*   D-Dimer No results for input(s): DDIMER in the last 72 hours. Hgb A1c  Recent Labs  08/16/14 0252  HGBA1C 12.9*   Thyroid function studies  Recent Labs  08/16/14 0252  TSH 0.881   Microbiology Recent Results (from the past 240 hour(s))  Culture, Urine     Status: None   Collection Time: 08/15/14  4:43 PM  Result Value Ref Range Status   Specimen Description URINE, CLEAN CATCH  Final   Special Requests NONE  Final   Colony Count   Final    1,000 COLONIES/ML Performed at [facility]    Culture   Final    INSIGNIFICANT GROWTH Performed at [facility]    Report Status 08/17/2014 FINAL  Final  MRSA PCR Screening     Status: None   Collection Time: 08/15/14  6:50 PM  Result Value Ref Range Status   MRSA by PCR NEGATIVE NEGATIVE Final    Comment:        The GeneXpert MRSA Assay (FDA approved for NASAL specimens only), is one component of a comprehensive MRSA colonization surveillance program. It is not intended to diagnose MRSA infection nor to guide or monitor treatment for MRSA infections.   Culture, blood (routine x 2)     Status: None  (Preliminary result)   Collection Time: 08/15/14  6:57 PM  Result Value Ref Range Status   Specimen Description BLOOD RIGHT ARM  Final   Special Requests BOTTLES DRAWN AEROBIC ONLY 10CC  Final   Culture   Final           BLOOD CULTURE RECEIVED NO GROWTH TO DATE CULTURE WILL BE HELD FOR 5 DAYS BEFORE ISSUING A FINAL NEGATIVE REPORT Performed at [facility]    Report Status PENDING  Incomplete  Culture, blood (routine x 2)     Status: None (Preliminary result)   Collection Time: 08/15/14  8:15 PM  Result Value Ref Range Status   Specimen Description BLOOD R HAND  Final  Special Requests BOTTLES DRAWN AEROBIC ONLY 10CC  Final   Culture   Final           BLOOD CULTURE RECEIVED NO GROWTH TO DATE CULTURE WILL BE HELD FOR 5 DAYS BEFORE ISSUING A FINAL NEGATIVE REPORT Performed at [facility]    Report Status PENDING  Incomplete     Discharge Instructions:       Discharge Instructions    Amb Referral to Nutrition [facility] Diabetic [facility]    Complete by:  As directed   A1c 12.9%.  Takes Lantus and Novolog insulins at home.  Phone Number: [phone]  Patient: Please call the  [facility] after discharge to schedule an appointment for diabetes education if you do not hear from the center before discharge  [phone]     Call MD for:  extreme fatigue    Complete by:  As directed      Call MD for:  persistant nausea and vomiting    Complete by:  As directed      Call MD for:  temperature >100.4    Complete by:  As directed      Call MD for:    Complete by:  As directed   Persistently elevated blood sugars greater than 200.     Diet Carb Modified    Complete by:  As directed      Discharge instructions    Complete by:  As directed   It is VERY IMPORTANT that you follow up with a PCP on a regular basis.  Check your blood glucoses before each meal and at bedtime and maintain a log of your readings.  Bring this log with you when you  follow up with your PCP so that he or she can adjust your insulin at your follow up visit.  You were cared for by Dr. Hillery Aldo  (a hospitalist) during your hospital stay. If you have any questions about your discharge medications or the care you received while you were in the hospital after you are discharged, you can call the unit and ask to speak with the hospitalist on call if the hospitalist that took care of you is not available. Once you are discharged, your primary care physician will handle any further medical issues. Please note that NO REFILLS for any discharge medications will be authorized once you are discharged, as it is imperative that you return to your primary care physician (or establish a relationship with a primary care physician if you do not have one) for your aftercare needs so that they can reassess your need for medications and monitor your lab values.  Any outstanding tests can be reviewed by your PCP at your follow up visit.  It is also important to review any medicine changes with your PCP.  Please bring these d/c instructions with you to your next visit so your physician can review these changes with you.  If you do not have a primary care physician, you can call [phone] for a physician referral.  It is highly recommended that you obtain a PCP for hospital follow up.     Increase activity slowly    Complete by:  As directed      Other Restrictions    Complete by:  As directed   Hospitalized 08/15/14-08/18/14.  May return to work 08/21/14 if feeling better and blood sugars under control (less than 200).            Medication List  STOP taking these medications        clindamycin 300 MG capsule  Commonly known as:  CLEOCIN     dextrose 5 % SOLN 50 mL with clindamycin 600 MG/4ML SOLN 600 mg      TAKE these medications        guaiFENesin 100 MG/5ML liquid  Commonly known as:  ROBITUSSIN  Take 200 mg by mouth 3 (three) times daily as needed for cough  (cough).     insulin aspart 100 UNIT/ML injection  Commonly known as:  novoLOG  Inject 6 Units into the skin 3 (three) times daily with meals.     insulin glargine 100 UNIT/ML injection  Commonly known as:  LANTUS  Inject 0.55 mLs (55 Units total) into the skin at bedtime.     levofloxacin 750 MG tablet  Commonly known as:  LEVAQUIN  Take 1 tablet (750 mg total) by mouth daily.     metoprolol tartrate 25 MG tablet  Commonly known as:  LOPRESSOR  Take 1 tablet (25 mg total) by mouth 2 (two) times daily.     ondansetron 4 MG tablet  Commonly known as:  ZOFRAN  Take 1 tablet (4 mg total) by mouth every 6 (six) hours as needed for nausea.       Follow-up Information    Follow up with Beverley FiedlerANKINS,VICTORIA, MD. Schedule an appointment as soon as possible for a visit in 2 weeks.   Specialty:  Family Medicine   Why:  Hospital follow up to ensure your diabetes is under good control.   Contact information:   [address] [place] [address] [phone]        Time coordinating discharge: 35 minutes.  Signed:  RAMA,CHRISTINA  Pager [phone] [facility] 08/18/2014, 2:07 PM

## 2014-08-18 NOTE — Progress Notes (Addendum)
Inpatient Diabetes Program Recommendations  AACE/ADA: New Consensus Statement on Inpatient Glycemic Control (2013)  Target Ranges:  Prepandial:   less than 140 mg/dL      Peak postprandial:   less than 180 mg/dL (1-2 hours)      Critically ill patients:  140 - 180 mg/dL     Results for Seth Cruz, Seth Cruz (MRN [id number]) as of 08/18/2014 06:30  Ref. Range 08/17/2014 07:40 08/17/2014 11:43 08/17/2014 16:24 08/17/2014 21:35  Glucose-Capillary Latest Range: 70-99 mg/dL 409244 (H) 811232 (H) 914201 (H) 151 (H)    Results for Seth Cruz, Seth Cruz (MRN [id number]) as of 08/18/2014 06:30  Ref. Range 08/16/2014 02:52  Hgb A1c MFr Bld Latest Range: <5.7 % 12.9 (H)     44 y.o. male with a PMH of type 2 diabetes with essential htn and prior history of lung abscess who was admitted 08/15/14 in DKA with acute kidney injury and hyperkalemia.   Glucoses were greater than 900 on admission with a WBC of 18, creatinine 2.55, potassium 6.7, pH 7.06, and bicarbonate less than 5.  Transitioned off IV insulin drip to SQ insulin.   Current Orders: Lantus 25 units bid (increased 01/17)      Novolog 6 units tidwc (added 01/17 at 12 noon)      Novolog Moderate SSI   **DM Coordinator will speak with patient today    Addendum 1245: Spoke with patient today about his home DM care.  Patient told me no one ever discussed how to titrate insulin with him before and that he has been self-titrating for a while.  Currently using Lantus 55 units QHS at home (has been using this dose for ~8 months)  Spoke with patient about the difference between Lantus insulin and Novolog insulin.  Discussed how these insulins work and how to take at home.  Discussed with patient that titration at home of insulins needs to be under the supervision of his PCP and that he should always discuss titrations with his PCP.  Encouraged patient to take his Lantus at the same time everyday (pt stated he would like to take daily at 4pm) and also explained to patient how  to take the Rx for Novolog 6 units tid with meals.  Encouraged patient to take his Novolog as soon as he begins his meal and to cut the dose in half if he is only going to eat a snack and not a full meal.  Encouraged patient to discuss starting a Sliding scale insulin regimen at home as well with his PCP Dr. Velna Hatchetobin Sanders at his next PCP appt.  Explained what an SSI regimen is and how he can use it at home to bring his high blood sugar levels down.    Also encouraged patient to check his CBGs more often at home when he is sick and to drink plenty of noncaloric liquids when he is sick as well to prevent dehydration.  Encouraged patient to still take his Lantus insulin when he is sick and explained to patient that he may need Novolog insulin as well when he is sick b/c illness often causes hyperglycemia.  Patient very receptive to all information and appreciative of my visit.    Will follow Ambrose FinlandJeannine Johnston Jil Penland RN, MSN, CDE Diabetes Coordinator Inpatient Diabetes Program Team Pager: [phone] (8a-10p)

## 2014-08-18 NOTE — Discharge Instructions (Signed)
Diabetic Ketoacidosis Diabetic ketoacidosis (DKA) is a life-threatening complication of type 1 diabetes. It must be quickly recognized and treated. Treatment requires hospitalization. CAUSES  When there is no insulin in the body, glucose (sugar) cannot be used, and the body breaks down fat for energy. When fat breaks down, acids (ketones) build up in the blood. Very high levels of glucose and high levels of acids lead to severe loss of body fluids (dehydration) and other dangerous chemical changes. This stresses your vital organs and can cause coma or death. SIGNS AND SYMPTOMS   Tiredness (fatigue).  Weight loss.  Excessive thirst.  Ketones in your urine.  Light-headedness.  Fruity or sweet smelling breath.  Excessive urination.  Visual changes.  Confusion or irritability.  Nausea or vomiting.  Rapid breathing.  Stomachache or abdominal pain. DIAGNOSIS  Your health care provider will diagnose DKA based on your history, physical exam, and blood tests. The health care provider will check to see if you have another illness that caused you to go into DKA. Most of this will be done quickly in an emergency room. TREATMENT   Fluid replacement to correct dehydration.  Insulin.  Correction of electrolytes, such as potassium and sodium.  Antibiotic medicines. PREVENTION  Always take your insulin. Do not skip your insulin injections.  If you are sick, treat yourself quickly. Your body often needs more insulin to fight the illness.  Check your blood glucose regularly.  Check urine ketones if your blood glucose is greater than 240 milligrams per deciliter (mg/dL).  Do not use outdated (expired) insulin.  If your blood glucose is high, drink plenty of fluids. This helps flush out ketones. HOME CARE INSTRUCTIONS   If you are sick, follow the advice of your health care provider.  To prevent dehydration, drink enough water and fluids to keep your urine clear or pale  yellow.  If you cannot eat, alternate between drinking fluids with sugar (soda, juices, flavored gelatin) and salty fluids (broth, bouillon).  If you can eat, follow your usual diet and drink sugar-free liquids (water, diet drinks).  Always take your usual dose of insulin. If you cannot eat or if your glucose is getting too low, call your health care provider for further instructions.  Continue to monitor your blood or urine ketones every 3-4 hours around the clock. Set your alarm clock or have someone wake you up. If you are too sick, have someone test it for you.  Rest and avoid exercise. SEEK MEDICAL CARE IF:   You have a fever.  You have ketones in your urine, or your blood glucose is higher than a level your health care provider suggests. You may need extra insulin. Call your health care provider if you need advice on adjusting your insulin.  You cannot drink at least a tablespoon (15 mL) of fluid every 15-20 minutes.  You have been vomiting for more than 2 hours.  You have symptoms of DKA:  Fruity smelling breath.  Breathing faster or slower.  Becoming very sleepy. SEEK IMMEDIATE MEDICAL CARE IF:   You have signs of dehydration:  Decreased urination.  Increased thirst.  Dry skin and mouth.  Light-headedness.  Your blood glucose is very high (as advised by your health care provider) twice in a row.  You faint.  You have chest pain or trouble breathing.  You have a sudden, severe headache.  You have sudden weakness in one arm or one leg.  You have sudden trouble speaking or swallowing.  You  have vomiting or diarrhea that is getting worse after 3 hours.  You have abdominal pain. MAKE SURE YOU:   Understand these instructions.  Will watch your condition.  Will get help right away if you are not doing well or get worse. Document Released: 07/15/2000 Document Revised: 07/23/2013 Document Reviewed: 01/21/2009 [facility] Patient Information 2015 [place]. This information is not intended to replace advice given to you by your health care provider. Make sure you discuss any questions you have with your health care provider.  Sinusitis Sinusitis is redness, soreness, and inflammation of the paranasal sinuses. Paranasal sinuses are air pockets within the bones of your face (beneath the eyes, the middle of the forehead, or above the eyes). In healthy paranasal sinuses, mucus is able to drain out, and air is able to circulate through them by way of your nose. However, when your paranasal sinuses are inflamed, mucus and air can become trapped. This can allow bacteria and other germs to grow and cause infection. Sinusitis can develop quickly and last only a short time (acute) or continue over a long period (chronic). Sinusitis that lasts for more than 12 weeks is considered chronic.  CAUSES  Causes of sinusitis include:  Allergies.  Structural abnormalities, such as displacement of the cartilage that separates your nostrils (deviated septum), which can decrease the air flow through your nose and sinuses and affect sinus drainage.  Functional abnormalities, such as when the small hairs (cilia) that line your sinuses and help remove mucus do not work properly or are not present. SIGNS AND SYMPTOMS  Symptoms of acute and chronic sinusitis are the same. The primary symptoms are pain and pressure around the affected sinuses. Other symptoms include:  Upper toothache.  Earache.  Headache.  Bad breath.  Decreased sense of smell and taste.  A cough, which worsens when you are lying flat.  Fatigue.  Fever.  Thick drainage from your nose, which often is green and may contain pus (purulent).  Swelling and warmth over the affected sinuses. DIAGNOSIS  Your health care provider will perform a physical exam. During the exam, your health care provider may:  Look in your nose for signs of abnormal growths in your nostrils (nasal polyps).  Tap over  the affected sinus to check for signs of infection.  View the inside of your sinuses (endoscopy) using an imaging device that has a light attached (endoscope). If your health care provider suspects that you have chronic sinusitis, one or more of the following tests may be recommended:  Allergy tests.  Nasal culture. A sample of mucus is taken from your nose, sent to a lab, and screened for bacteria.  Nasal cytology. A sample of mucus is taken from your nose and examined by your health care provider to determine if your sinusitis is related to an allergy. TREATMENT  Most cases of acute sinusitis are related to a viral infection and will resolve on their own within 10 days. Sometimes medicines are prescribed to help relieve symptoms (pain medicine, decongestants, nasal steroid sprays, or saline sprays).  However, for sinusitis related to a bacterial infection, your health care provider will prescribe antibiotic medicines. These are medicines that will help kill the bacteria causing the infection.  Rarely, sinusitis is caused by a fungal infection. In theses cases, your health care provider will prescribe antifungal medicine. For some cases of chronic sinusitis, surgery is needed. Generally, these are cases in which sinusitis recurs more than 3 times per year, despite other treatments. HOME  CARE INSTRUCTIONS   Drink plenty of water. Water helps thin the mucus so your sinuses can drain more easily.  Use a humidifier.  Inhale steam 3 to 4 times a day (for example, sit in the bathroom with the shower running).  Apply a warm, moist washcloth to your face 3 to 4 times a day, or as directed by your health care provider.  Use saline nasal sprays to help moisten and clean your sinuses.  Take medicines only as directed by your health care provider.  If you were prescribed either an antibiotic or antifungal medicine, finish it all even if you start to feel better. SEEK IMMEDIATE MEDICAL CARE  IF:  You have increasing pain or severe headaches.  You have nausea, vomiting, or drowsiness.  You have swelling around your face.  You have vision problems.  You have a stiff neck.  You have difficulty breathing. MAKE SURE YOU:   Understand these instructions.  Will watch your condition.  Will get help right away if you are not doing well or get worse. Document Released: 07/18/2005 Document Revised: 12/02/2013 Document Reviewed: 08/02/2011 [facility] Patient Information 2015 [place], [place]. This information is not intended to replace advice given to you by your health care provider. Make sure you discuss any questions you have with your health care provider.

## 2014-08-18 NOTE — Progress Notes (Signed)
CARE MANAGEMENT NOTE 08/18/2014  Patient:  Seth Cruz,Seth Cruz   Account Number:  [id number]  Date Initiated:  08/18/2014  Documentation initiated by:  Ferdinand CavaSCHETTINO,Jayci Ellefson  Subjective/Objective Assessment:   44 yo male admitted with DKA with [employer] from home with spouse     Action/Plan:   discharge planning   Anticipated DC Date:  08/18/2014   Anticipated DC Plan:  HOME/SELF CARE      DC Planning Services  CM consult      Choice offered to / List presented to:             Status of service:  Completed, signed off Medicare Important Message given?   (If response is "NO", the following Medicare IM given date fields will be blank) Date Medicare IM given:   Medicare IM given by:   Date Additional Medicare IM given:   Additional Medicare IM given by:    Discharge Disposition:  HOME/SELF CARE  Per UR Regulation:    If discussed at Long Length of Stay Meetings, dates discussed:    Comments:  08/18/14 Ferdinand CavaAndrea Schettino RN BSN CM [phone] Patient stated that even with insurance he has a difficult time affording his insulin pens. Patient has PCP and pharmacy is [facility]. Encouraged patient to discuss insulin options with his PCP fro more affordable options. Also provided a [facility] $4 list and list of [facility] insulins.

## 2014-08-22 LAB — CULTURE, BLOOD (ROUTINE X 2)
CULTURE: NO GROWTH
Culture: NO GROWTH

## 2014-09-09 ENCOUNTER — Ambulatory Visit: Payer: 59 | Admitting: *Deleted

## 2014-11-15 ENCOUNTER — Encounter (HOSPITAL_COMMUNITY): Payer: Self-pay | Admitting: Emergency Medicine

## 2014-11-15 ENCOUNTER — Emergency Department (HOSPITAL_COMMUNITY)
Admission: EM | Admit: 2014-11-15 | Discharge: 2014-11-15 | Disposition: A | Discharge status: Home / Self Care | Payer: 59 | Attending: Emergency Medicine | Admitting: Emergency Medicine

## 2014-11-15 DIAGNOSIS — I10 Essential (primary) hypertension: Secondary | ICD-10-CM | POA: Diagnosis not present

## 2014-11-15 DIAGNOSIS — E1310 Other specified diabetes mellitus with ketoacidosis without coma: Secondary | ICD-10-CM | POA: Diagnosis not present

## 2014-11-15 DIAGNOSIS — Z8709 Personal history of other diseases of the respiratory system: Secondary | ICD-10-CM | POA: Insufficient documentation

## 2014-11-15 DIAGNOSIS — Z792 Long term (current) use of antibiotics: Secondary | ICD-10-CM | POA: Diagnosis not present

## 2014-11-15 DIAGNOSIS — Y92410 Unspecified street and highway as the place of occurrence of the external cause: Secondary | ICD-10-CM | POA: Diagnosis not present

## 2014-11-15 DIAGNOSIS — Z87891 Personal history of nicotine dependence: Secondary | ICD-10-CM | POA: Diagnosis not present

## 2014-11-15 DIAGNOSIS — S199XXA Unspecified injury of neck, initial encounter: Secondary | ICD-10-CM | POA: Insufficient documentation

## 2014-11-15 DIAGNOSIS — S3992XA Unspecified injury of lower back, initial encounter: Secondary | ICD-10-CM | POA: Diagnosis not present

## 2014-11-15 DIAGNOSIS — Y9389 Activity, other specified: Secondary | ICD-10-CM | POA: Insufficient documentation

## 2014-11-15 DIAGNOSIS — Z794 Long term (current) use of insulin: Secondary | ICD-10-CM | POA: Diagnosis not present

## 2014-11-15 DIAGNOSIS — Y998 Other external cause status: Secondary | ICD-10-CM | POA: Insufficient documentation

## 2014-11-15 DIAGNOSIS — Z79899 Other long term (current) drug therapy: Secondary | ICD-10-CM | POA: Diagnosis not present

## 2014-11-15 DIAGNOSIS — S39012A Strain of muscle, fascia and tendon of lower back, initial encounter: Secondary | ICD-10-CM | POA: Diagnosis present

## 2014-11-15 MED ORDER — LIDOCAINE 5 % EX PTCH: 1.0000 | MEDICATED_PATCH | CUTANEOUS | Status: DC

## 2014-11-15 MED ORDER — TRAMADOL HCL 50 MG PO TABS: 50.0000 mg | ORAL_TABLET | Freq: Four times a day (QID) | ORAL | Status: DC | PRN

## 2014-11-15 MED ORDER — NAPROXEN 500 MG PO TABS: 500.0000 mg | ORAL_TABLET | Freq: Two times a day (BID) | ORAL | Status: DC
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
## 2014-11-15 NOTE — Discharge Instructions (Signed)
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
## 2015-03-04 ENCOUNTER — Encounter (HOSPITAL_COMMUNITY): Payer: Self-pay | Admitting: Emergency Medicine

## 2015-03-04 ENCOUNTER — Emergency Department (HOSPITAL_COMMUNITY): Payer: 59

## 2015-03-04 ENCOUNTER — Inpatient Hospital Stay (HOSPITAL_COMMUNITY)
Admission: EM | Admit: 2015-03-04 | Discharge: 2015-03-06 | DRG: 637 | Disposition: A | Discharge status: Home / Self Care | Payer: 59 | Attending: Internal Medicine | Admitting: Internal Medicine

## 2015-03-04 DIAGNOSIS — E119 Type 2 diabetes mellitus without complications: Secondary | ICD-10-CM

## 2015-03-04 DIAGNOSIS — E1110 Type 2 diabetes mellitus with ketoacidosis without coma: Secondary | ICD-10-CM | POA: Diagnosis present

## 2015-03-04 DIAGNOSIS — E08321 Diabetes mellitus due to underlying condition with mild nonproliferative diabetic retinopathy with macular edema: Secondary | ICD-10-CM

## 2015-03-04 DIAGNOSIS — A419 Sepsis, unspecified organism: Secondary | ICD-10-CM | POA: Diagnosis present

## 2015-03-04 DIAGNOSIS — A084 Viral intestinal infection, unspecified: Secondary | ICD-10-CM | POA: Diagnosis present

## 2015-03-04 DIAGNOSIS — E860 Dehydration: Secondary | ICD-10-CM | POA: Diagnosis present

## 2015-03-04 DIAGNOSIS — R112 Nausea with vomiting, unspecified: Secondary | ICD-10-CM | POA: Diagnosis present

## 2015-03-04 DIAGNOSIS — Z794 Long term (current) use of insulin: Secondary | ICD-10-CM

## 2015-03-04 DIAGNOSIS — D72829 Elevated white blood cell count, unspecified: Secondary | ICD-10-CM | POA: Diagnosis not present

## 2015-03-04 DIAGNOSIS — E875 Hyperkalemia: Secondary | ICD-10-CM | POA: Diagnosis present

## 2015-03-04 DIAGNOSIS — E1010 Type 1 diabetes mellitus with ketoacidosis without coma: Secondary | ICD-10-CM | POA: Diagnosis not present

## 2015-03-04 DIAGNOSIS — Z9114 Patient's other noncompliance with medication regimen: Secondary | ICD-10-CM | POA: Diagnosis present

## 2015-03-04 DIAGNOSIS — N179 Acute kidney failure, unspecified: Secondary | ICD-10-CM | POA: Diagnosis present

## 2015-03-04 DIAGNOSIS — E1310 Other specified diabetes mellitus with ketoacidosis without coma: Principal | ICD-10-CM | POA: Diagnosis present

## 2015-03-04 DIAGNOSIS — Z87891 Personal history of nicotine dependence: Secondary | ICD-10-CM

## 2015-03-04 DIAGNOSIS — Z833 Family history of diabetes mellitus: Secondary | ICD-10-CM | POA: Diagnosis not present

## 2015-03-04 DIAGNOSIS — I10 Essential (primary) hypertension: Secondary | ICD-10-CM | POA: Diagnosis present

## 2015-03-04 DIAGNOSIS — G43A0 Cyclical vomiting, not intractable: Secondary | ICD-10-CM | POA: Diagnosis not present

## 2015-03-04 LAB — BLOOD GAS, VENOUS
Acid-base deficit: 24.1 mmol/L — ABNORMAL HIGH (ref 0.0–2.0)
BICARBONATE: 4.7 meq/L — AB (ref 20.0–24.0)
DRAWN BY: 103701
O2 SAT: 81.5 %
PATIENT TEMPERATURE: 98.6
PCO2 VEN: 15.3 mmHg — AB (ref 45.0–50.0)
PH VEN: 7.116 — AB (ref 7.250–7.300)
TCO2: 4.5 mmol/L (ref 0–100)
pO2, Ven: 63.7 mmHg — ABNORMAL HIGH (ref 30.0–45.0)

## 2015-03-04 LAB — CBC
HCT: 41.4 % (ref 39.0–52.0)
Hemoglobin: 13.8 g/dL (ref 13.0–17.0)
MCH: 25.4 pg — ABNORMAL LOW (ref 26.0–34.0)
MCHC: 33.3 g/dL (ref 30.0–36.0)
MCV: 76.2 fL — ABNORMAL LOW (ref 78.0–100.0)
Platelets: 235 10*3/uL (ref 150–400)
RBC: 5.43 MIL/uL (ref 4.22–5.81)
WBC: 15 10*3/uL — AB (ref 4.0–10.5)

## 2015-03-04 LAB — COMPREHENSIVE METABOLIC PANEL
ALT: 30 U/L (ref 17–63)
ALT: 27 U/L (ref 17–63)
AST: 21 U/L (ref 15–41)
AST: 21 U/L (ref 15–41)
Albumin: 4.3 g/dL (ref 3.5–5.0)
Albumin: 3.7 g/dL (ref 3.5–5.0)
Alkaline Phosphatase: 103 U/L (ref 38–126)
Alkaline Phosphatase: 86 U/L (ref 38–126)
Anion gap: 30 — ABNORMAL HIGH (ref 5–15)
Anion gap: 19 — ABNORMAL HIGH (ref 5–15)
BUN: 69 mg/dL — ABNORMAL HIGH (ref 6–20)
BUN: 69 mg/dL — ABNORMAL HIGH (ref 6–20)
CO2: 6 mmol/L — AB (ref 22–32)
CO2: 9 mmol/L — ABNORMAL LOW (ref 22–32)
CREATININE: 2.63 mg/dL — AB (ref 0.61–1.24)
Calcium: 9.8 mg/dL (ref 8.9–10.3)
Calcium: 8.6 mg/dL — ABNORMAL LOW (ref 8.9–10.3)
Chloride: 98 mmol/L — ABNORMAL LOW (ref 101–111)
Chloride: 109 mmol/L (ref 101–111)
Creatinine, Ser: 2.23 mg/dL — ABNORMAL HIGH (ref 0.61–1.24)
GFR calc Af Amer: 32 mL/min — ABNORMAL LOW (ref >60)
GFR calc Af Amer: 39 mL/min — ABNORMAL LOW (ref >60)
GFR calc non Af Amer: 28 mL/min — ABNORMAL LOW (ref >60)
GFR calc non Af Amer: 34 mL/min — ABNORMAL LOW (ref >60)
GLUCOSE: 940 mg/dL — AB (ref 65–99)
GLUCOSE: 689 mg/dL — AB (ref 65–99)
POTASSIUM: 4.3 mmol/L (ref 3.5–5.1)
Potassium: 5.6 mmol/L — ABNORMAL HIGH (ref 3.5–5.1)
Sodium: 134 mmol/L — ABNORMAL LOW (ref 135–145)
Sodium: 137 mmol/L (ref 135–145)
Total Bilirubin: 2.3 mg/dL — ABNORMAL HIGH (ref 0.3–1.2)
Total Bilirubin: 1.8 mg/dL — ABNORMAL HIGH (ref 0.3–1.2)
Total Protein: 7.8 g/dL (ref 6.5–8.1)
Total Protein: 7 g/dL (ref 6.5–8.1)

## 2015-03-04 LAB — URINALYSIS, ROUTINE W REFLEX MICROSCOPIC
BILIRUBIN URINE: NEGATIVE
Glucose, UA: >1000 mg/dL — AB
Leukocytes, UA: NEGATIVE
Nitrite: NEGATIVE
Protein, ur: NEGATIVE mg/dL
Specific Gravity, Urine: 1.027 (ref 1.005–1.030)
UROBILINOGEN UA: 0.2 mg/dL (ref 0.0–1.0)
pH: 5 (ref 5.0–8.0)

## 2015-03-04 LAB — GLUCOSE, CAPILLARY
GLUCOSE-CAPILLARY: 556 mg/dL — AB (ref 65–99)
Glucose-Capillary: >600 mg/dL (ref 65–99)
Glucose-Capillary: 464 mg/dL — ABNORMAL HIGH (ref 65–99)
Glucose-Capillary: 363 mg/dL — ABNORMAL HIGH (ref 65–99)
Glucose-Capillary: 317 mg/dL — ABNORMAL HIGH (ref 65–99)

## 2015-03-04 LAB — CBC WITH DIFFERENTIAL/PLATELET
BASOS ABS: 0 10*3/uL (ref 0.0–0.1)
Basophils Relative: 0 % (ref 0–1)
EOS ABS: 0 10*3/uL (ref 0.0–0.7)
Eosinophils Relative: 0 % (ref 0–5)
HEMATOCRIT: 36 % — AB (ref 39.0–52.0)
Hemoglobin: 11.8 g/dL — ABNORMAL LOW (ref 13.0–17.0)
LYMPHS ABS: 1.3 10*3/uL (ref 0.7–4.0)
Lymphocytes Relative: 8 % — ABNORMAL LOW (ref 12–46)
MCH: 25.4 pg — ABNORMAL LOW (ref 26.0–34.0)
MCHC: 32.8 g/dL (ref 30.0–36.0)
MCV: 77.4 fL — ABNORMAL LOW (ref 78.0–100.0)
MONO ABS: 1.4 10*3/uL — AB (ref 0.1–1.0)
Monocytes Relative: 9 % (ref 3–12)
NEUTROS ABS: 13.4 10*3/uL — AB (ref 1.7–7.7)
Neutrophils Relative %: 83 % — ABNORMAL HIGH (ref 43–77)
Platelets: 248 10*3/uL (ref 150–400)
RBC: 4.65 MIL/uL (ref 4.22–5.81)
RDW: 15.7 % — AB (ref 11.5–15.5)
WBC: 16.1 10*3/uL — ABNORMAL HIGH (ref 4.0–10.5)

## 2015-03-04 LAB — I-STAT CG4 LACTIC ACID, ED: LACTIC ACID, VENOUS: 4.77 mmol/L — AB (ref 0.5–2.0)

## 2015-03-04 LAB — APTT: aPTT: 24 seconds (ref 24–37)

## 2015-03-04 LAB — CBG MONITORING, ED
Glucose-Capillary: >600 mg/dL (ref 65–99)
Glucose-Capillary: >600 mg/dL (ref 65–99)

## 2015-03-04 LAB — BASIC METABOLIC PANEL
ANION GAP: 14 (ref 5–15)
BUN: 59 mg/dL — AB (ref 6–20)
CHLORIDE: 114 mmol/L — AB (ref 101–111)
CO2: 14 mmol/L — ABNORMAL LOW (ref 22–32)
Calcium: 9 mg/dL (ref 8.9–10.3)
Creatinine, Ser: 1.6 mg/dL — ABNORMAL HIGH (ref 0.61–1.24)
GFR calc non Af Amer: 50 mL/min — ABNORMAL LOW (ref >60)
GFR, EST AFRICAN AMERICAN: 58 mL/min — AB (ref >60)
GLUCOSE: 406 mg/dL — AB (ref 65–99)
Potassium: 3.9 mmol/L (ref 3.5–5.1)
SODIUM: 142 mmol/L (ref 135–145)

## 2015-03-04 LAB — LACTIC ACID, PLASMA
Lactic Acid, Venous: 3 mmol/L (ref 0.5–2.0)
Lactic Acid, Venous: 2.4 mmol/L (ref 0.5–2.0)

## 2015-03-04 LAB — PROTIME-INR
INR: 1.12 (ref 0.00–1.49)
Prothrombin Time: 14.6 seconds (ref 11.6–15.2)

## 2015-03-04 LAB — PROCALCITONIN: Procalcitonin: 0.66 ng/mL

## 2015-03-04 LAB — TSH: TSH: 0.449 u[IU]/mL (ref 0.350–4.500)

## 2015-03-04 LAB — URINE MICROSCOPIC-ADD ON

## 2015-03-04 MED ORDER — SODIUM CHLORIDE 0.9 % IV BOLUS (SEPSIS)
1000.0000 mL | Freq: Once | INTRAVENOUS | Status: AC
  Administered 2015-03-04: 1000 mL via INTRAVENOUS

## 2015-03-04 MED ORDER — ONDANSETRON HCL 4 MG PO TABS: 4.0000 mg | ORAL_TABLET | Freq: Four times a day (QID) | ORAL | Status: DC | PRN

## 2015-03-04 MED ORDER — PIPERACILLIN-TAZOBACTAM 3.375 G IVPB
3.3750 g | Freq: Three times a day (TID) | INTRAVENOUS | Status: DC
  Administered 2015-03-05 (×2): 3.375 g via INTRAVENOUS
  Filled 2015-03-04: qty 50

## 2015-03-04 MED ORDER — ACETAMINOPHEN 325 MG PO TABS: 650.0000 mg | ORAL_TABLET | Freq: Four times a day (QID) | ORAL | Status: DC | PRN

## 2015-03-04 MED ORDER — SODIUM CHLORIDE 0.9 % IV SOLN
INTRAVENOUS | Status: DC
  Filled 2015-03-04 (×2): qty 2.5

## 2015-03-04 MED ORDER — ONDANSETRON HCL 4 MG/2ML IJ SOLN
4.0000 mg | Freq: Once | INTRAMUSCULAR | Status: AC
  Administered 2015-03-04: 4 mg via INTRAVENOUS
  Filled 2015-03-04: qty 2

## 2015-03-04 MED ORDER — ENOXAPARIN SODIUM 40 MG/0.4ML ~~LOC~~ SOLN
40.0000 mg | SUBCUTANEOUS | Status: DC
Start: 2015-03-04 — End: 2015-03-06
  Administered 2015-03-04 – 2015-03-05 (×2): 40 mg via SUBCUTANEOUS
  Filled 2015-03-04 (×3): qty 0.4

## 2015-03-04 MED ORDER — HYDROCODONE-ACETAMINOPHEN 5-325 MG PO TABS: 1.0000 | ORAL_TABLET | Freq: Four times a day (QID) | ORAL | Status: DC | PRN

## 2015-03-04 MED ORDER — METOPROLOL TARTRATE 1 MG/ML IV SOLN
5.0000 mg | Freq: Four times a day (QID) | INTRAVENOUS | Status: DC | PRN
Start: 2015-03-04 — End: 2015-03-06

## 2015-03-04 MED ORDER — SODIUM CHLORIDE 0.9 % IV SOLN
INTRAVENOUS | Status: DC
  Administered 2015-03-04: 5.4 [IU]/h via INTRAVENOUS
  Filled 2015-03-04: qty 2.5

## 2015-03-04 MED ORDER — ONDANSETRON HCL 4 MG/2ML IJ SOLN
4.0000 mg | Freq: Four times a day (QID) | INTRAMUSCULAR | Status: DC | PRN
  Administered 2015-03-04 – 2015-03-05 (×2): 4 mg via INTRAVENOUS
  Filled 2015-03-04 (×2): qty 2

## 2015-03-04 MED ORDER — PIPERACILLIN-TAZOBACTAM 3.375 G IVPB 30 MIN
3.3750 g | Freq: Once | INTRAVENOUS | Status: AC
  Administered 2015-03-04: 3.375 g via INTRAVENOUS
  Filled 2015-03-04 (×2): qty 50

## 2015-03-04 MED ORDER — DEXTROSE-NACL 5-0.45 % IV SOLN
INTRAVENOUS | Status: DC
  Administered 2015-03-05: 01:00:00 via INTRAVENOUS

## 2015-03-04 MED ORDER — DEXTROSE 50 % IV SOLN: 25.0000 mL | INTRAVENOUS | Status: DC | PRN

## 2015-03-04 MED ORDER — INSULIN REGULAR BOLUS VIA INFUSION
0.0000 [IU] | Freq: Three times a day (TID) | INTRAVENOUS | Status: DC
  Administered 2015-03-05: 0 [IU] via INTRAVENOUS
  Filled 2015-03-04: qty 10

## 2015-03-04 MED ORDER — ACETAMINOPHEN 650 MG RE SUPP: 650.0000 mg | Freq: Four times a day (QID) | RECTAL | Status: DC | PRN

## 2015-03-04 MED ORDER — DEXTROSE-NACL 5-0.45 % IV SOLN
INTRAVENOUS | Status: DC
Start: 2015-03-04 — End: 2015-03-04

## 2015-03-04 MED ORDER — PANTOPRAZOLE SODIUM 40 MG IV SOLR
40.0000 mg | Freq: Two times a day (BID) | INTRAVENOUS | Status: DC
  Administered 2015-03-04 – 2015-03-05 (×2): 40 mg via INTRAVENOUS
  Filled 2015-03-04 (×2): qty 40

## 2015-03-04 MED ORDER — SODIUM CHLORIDE 0.9 % IV SOLN
INTRAVENOUS | Status: DC
  Administered 2015-03-04 – 2015-03-06 (×4): via INTRAVENOUS

## 2015-03-04 NOTE — ED Provider Notes (Signed)
CSN: [id number]     Arrival date & time 03/04/15  1247 History   First MD Initiated Contact with Patient 03/04/15 1311     Chief Complaint  Patient presents with  . Emesis     (Consider location/radiation/quality/duration/timing/severity/associated sxs/prior Treatment) HPI   Patient is a 44 year old male presenting today with 2 days of vomiting and unable to take fluids. Patient's sugar last night was 250. Today was over 600. Patient has a history of DKA. On arrival patient is Kussmaul breathing. Unclear where the source of infection is. Patient recently had tooth removed however there is no erythema or swelling around the site.  Past Medical History  Diagnosis Date  . Hypertension   . Diabetes mellitus   . Sinusitis 08/18/2014  . DKA (diabetic ketoacidoses) 08/15/2014   Past Surgical History  Procedure Laterality Date  . No past surgeries     No family history on file. History  Substance Use Topics  . Smoking status: Former Smoker -- 0.25 packs/day    Types: Cigarettes    Quit date: 05/05/2012  . Smokeless tobacco: Never Used  . Alcohol Use: Yes     Comment: twice a month a couple of beers    Review of Systems  Constitutional: Positive for fatigue. Negative for fever and activity change.  HENT: Negative for drooling and hearing loss.   Eyes: Negative for discharge and redness.  Respiratory: Negative for cough and shortness of breath.   Cardiovascular: Negative for chest pain.  Gastrointestinal: Positive for vomiting and abdominal pain.  Genitourinary: Negative for dysuria and urgency.  Musculoskeletal: Negative for arthralgias.  Allergic/Immunologic: Negative for immunocompromised state.  Neurological: Negative for seizures and speech difficulty.  Psychiatric/Behavioral: Negative for behavioral problems and agitation.  All other systems reviewed and are negative.     Allergies  Review of patient's allergies indicates no known allergies.  Home Medications    Prior to Admission medications   Medication Sig Start Date End Date Taking? Authorizing Provider  insulin aspart (NOVOLOG) 100 UNIT/ML injection Inject 6 Units into the skin 3 (three) times daily with meals. Patient taking differently: Inject 6-8 Units into the skin 3 (three) times daily with meals. Sliding scale 08/18/14  Yes Christina P Rama, MD  insulin detemir (LEVEMIR) 100 UNIT/ML injection Inject 35 Units into the skin daily.   Yes Historical Provider, MD  insulin glargine (LANTUS) 100 UNIT/ML injection Inject 0.55 mLs (55 Units total) into the skin at bedtime. Patient not taking: Reported on 03/04/2015 08/18/14   Maryruth Bun Rama, MD  lidocaine (LIDODERM) 5 % Place 1 patch onto the skin daily. Remove & Discard patch within 12 hours or as directed by MD Patient not taking: Reported on 03/04/2015 11/15/14   Arthor Captain, PA-C  metoprolol tartrate (LOPRESSOR) 25 MG tablet Take 1 tablet (25 mg total) by mouth 2 (two) times daily. Patient not taking: Reported on 03/04/2015 05/15/12   Henderson Cloud, MD  naproxen (NAPROSYN) 500 MG tablet Take 1 tablet (500 mg total) by mouth 2 (two) times daily with a meal. Patient not taking: Reported on 03/04/2015 11/15/14   Arthor Captain, PA-C  traMADol (ULTRAM) 50 MG tablet Take 1 tablet (50 mg total) by mouth every 6 (six) hours as needed. Patient not taking: Reported on 03/04/2015 11/15/14   Arthor Captain, PA-C   BP 97/68 mmHg  Pulse 116  Temp(Src) 97.5 F (36.4 C) (Oral)  Resp 20  SpO2 100% Physical Exam  Constitutional: He is oriented to person, place, and  time. He appears well-nourished.  HENT:  Head: Normocephalic.  Mouth/Throat: Oropharynx is clear and moist.  Dry mucous membranes  Recently removed teeth with no surrounding erythema or abscess.  Eyes: Conjunctivae are normal.  Neck: No tracheal deviation present.  Cardiovascular: Normal rate.   Murmur  Pulmonary/Chest: Effort normal. No stridor. No respiratory distress.  Abdominal:  Soft. There is no tenderness. There is no guarding.  Musculoskeletal: Normal range of motion. He exhibits no edema.  Neurological: He is oriented to person, place, and time. No cranial nerve deficit.  Skin: Skin is dry. No rash noted. He is not diaphoretic.  Psychiatric: He has a normal mood and affect.  Nursing note and vitals reviewed.   ED Course  Procedures (including critical care time) Labs Review Labs Reviewed  COMPREHENSIVE METABOLIC PANEL - Abnormal; Notable for the following:    Potassium 5.6 (*)    Glucose, Bld 940 (*)    BUN 69 (*)    Creatinine, Ser 2.63 (*)    Total Bilirubin 2.3 (*)    GFR calc non Af Amer 28 (*)    GFR calc Af Amer 32 (*)    All other components within normal limits  BLOOD GAS, VENOUS - Abnormal; Notable for the following:    pH, Ven 7.116 (*)    pCO2, Ven 15.3 (*)    pO2, Ven 63.7 (*)    Bicarbonate 4.7 (*)    Acid-base deficit 24.1 (*)    All other components within normal limits  CBG MONITORING, ED - Abnormal; Notable for the following:    Glucose-Capillary >600 (*)    All other components within normal limits  I-STAT CG4 LACTIC ACID, ED - Abnormal; Notable for the following:    Lactic Acid, Venous 4.77 (*)    All other components within normal limits  CBG MONITORING, ED - Abnormal; Notable for the following:    Glucose-Capillary >600 (*)    All other components within normal limits  CBC  URINALYSIS, ROUTINE W REFLEX MICROSCOPIC (NOT AT [facility])    Imaging Review Dg Chest 2 View  03/04/2015   CLINICAL DATA:  Shortness of breath for 1 day  EXAM: CHEST  2 VIEW  COMPARISON:  August 16, 2014  FINDINGS: There is no edema or consolidation. The heart size and pulmonary vascularity are normal. No adenopathy. No bone lesions.  IMPRESSION: No edema or consolidation.   Electronically Signed   By: Bretta Bang III M.D.   On: 03/04/2015 14:13     EKG Interpretation None      MDM   Final diagnoses:  None   patient is a 44 year old male  presenting today with likely DKA. 2 days of vomiting and inability to take fluids. Given his Kussmaul breathing and blood sugar over 600, anticipate findings of DKA. Will get chest x-ray and urine for source of infection. Patient did report some black vomit. We will test for blood/look for coffee ground emesis if he does vomit here. No history of varices.  Patient's pH is 7.1. Glucose is 987. Patient is in DKA. Glucose order set ordered. Patient will need admission to stepdown.   CRITICAL CARE Performed by: Arlana Hove   Total critical care time: 30  Critical care time was exclusive of separately billable procedures and treating other patients.  Critical care was necessary to treat or prevent imminent or life-threatening deterioration.  Critical care was time spent personally by me on the following activities: development of treatment plan with patient  and/or surrogate as well as nursing, discussions with consultants, evaluation of patient's response to treatment, examination of patient, obtaining history from patient or surrogate, ordering and performing treatments and interventions, ordering and review of laboratory studies, ordering and review of radiographic studies, pulse oximetry and re-evaluation of patient's condition.    Doyel Mulkern Randall An, MD 03/04/15 1451

## 2015-03-04 NOTE — ED Notes (Signed)
Patient transported to X-ray 

## 2015-03-04 NOTE — Progress Notes (Signed)
4L bolus given overall since admit.

## 2015-03-04 NOTE — ED Notes (Signed)
Pt c/o emesis starting yesterday and "a little" diarrhea.  Pt reports that he recently had teeth removed, but they did not prescribe him antibiotics.

## 2015-03-04 NOTE — ED Notes (Signed)
Per patient, states vomiting since yesterday-increased thirst, cant keep anything down

## 2015-03-04 NOTE — H&P (Addendum)
[facility] History and Physical  Seth Cruz XBJ:[id number] DOB: [birth year] DOA: 03/04/2015  Referring physician:  Zenovia Jarred PCP:  Maximino Greenland, MD   Chief Complaint:  Vomiting and diarrhea  HPI:  The patient is a 44 y.o. year-old male with history of uncontrolled diabetes mellitus type 2, hypertension, history of lung abscess who presents with nausea, vomiting, diarrhea.  The patient states that he has difficulty affording rent, food for him and his family, and his medications. He does not have Medicaid and is on his wife's health insurance. He works as a Administrator. He ran out of his long-acting insulin 5 days prior to admission and states that he normally would use only 1 test strip per day.  Because he was only testing his blood sugars once a day, he was only giving himself his Evora Schechter acting insulin once daily.  His blood sugars trended up to the 300s the last time he checked. He gives himself sliding scale insulin and was giving himself 6 units of NovoLog once a day.  Three days prior to admission, he developed nausea with vomiting of coffee-ground emesis appearing material, nonbilious. He also had watery diarrhea approximately 3 times per day for 2 days. He was unable to keep anything other than water and ginger ale down.  + polyuria and polydipsia.  He became weak and faint and therefore came to the ER.  He feels very thirsty.  He denies fevers, chills, cough, dysuria, skin rash or ulcer.  He had some teeth pulled about two weeks prior to admission, but had no complications after they were extracted.    In the ER, he was tachycardic to the 110s with normal temperature and blood pressure.  CXR was negative.  UA without evidence of infection but positive for glucose and ketones.  WBC 15.  BMP concerning for CO2 of 6, anion gap of 30 and glucose 940.  Lactic acid 4.77.  PH 7.116.  He was given 3L NS and started on insulin gtt.  His blood sugars have been >600 for the last 3 hours.     Review of Systems:  General:  Denies fevers, chills, weight loss or gain HEENT:  Denies changes to hearing and vision, rhinorrhea, sinus congestion, sore throat CV:  Denies chest pain and palpitations, lower extremity edema.  PULM:  Positive SOB, denies wheezing, cough.   GI:  Per HPI GU:  Denies dysuria, frequency, urgency ENDO:  Positive polyuria, polydipsia.   HEME:  Denies blood in stools, melena, abnormal bruising.  + coffee ground emesis LYMPH:  Denies lymphadenopathy.   MSK:  Denies arthralgias, myalgias.   DERM:  Denies skin rash or ulcer.   NEURO:  Denies focal numbness, weakness, slurred speech, confusion, facial droop.  PSYCH:  Denies anxiety and depression.    Past Medical History  Diagnosis Date  . Hypertension   . Diabetes mellitus   . Sinusitis 08/18/2014  . DKA (diabetic ketoacidoses) 08/15/2014   Past Surgical History  Procedure Laterality Date  . No past surgeries     Social History:  reports that he quit smoking about 2 years ago. His smoking use included Cigarettes. He smoked 0.25 packs per day. He has never used smokeless tobacco. He reports that he drinks alcohol. He reports that he uses illicit drugs.  No Known Allergies  Family History  Problem Relation Age of Onset  . Diabetes Mellitus II Mother   . High blood pressure Mother      Prior to Admission medications  Medication Sig Start Date End Date Taking? Authorizing Provider  insulin aspart (NOVOLOG) 100 UNIT/ML injection Inject 6 Units into the skin 3 (three) times daily with meals. Patient taking differently: Inject 6-8 Units into the skin 3 (three) times daily with meals. Sliding scale 08/18/14  Yes Christina P Rama, MD  insulin detemir (LEVEMIR) 100 UNIT/ML injection Inject 35 Units into the skin daily.   Yes Historical Provider, MD  insulin glargine (LANTUS) 100 UNIT/ML injection Inject 0.55 mLs (55 Units total) into the skin at bedtime. Patient not taking: Reported on 03/04/2015 08/18/14    Venetia Maxon Rama, MD  lidocaine (LIDODERM) 5 % Place 1 patch onto the skin daily. Remove & Discard patch within 12 hours or as directed by MD Patient not taking: Reported on 03/04/2015 11/15/14   Margarita Mail, PA-C  metoprolol tartrate (LOPRESSOR) 25 MG tablet Take 1 tablet (25 mg total) by mouth 2 (two) times daily. Patient not taking: Reported on 03/04/2015 05/15/12   Erline Hau, MD  naproxen (NAPROSYN) 500 MG tablet Take 1 tablet (500 mg total) by mouth 2 (two) times daily with a meal. Patient not taking: Reported on 03/04/2015 11/15/14   Margarita Mail, PA-C  traMADol (ULTRAM) 50 MG tablet Take 1 tablet (50 mg total) by mouth every 6 (six) hours as needed. Patient not taking: Reported on 03/04/2015 11/15/14   Margarita Mail, PA-C   Physical Exam: Filed Vitals:   03/04/15 1630 03/04/15 1655 03/04/15 1700 03/04/15 1800  BP: 145/87 145/87 111/89 129/82  Pulse:  116 114 110  Temp:      TempSrc:      Resp: _0 Height:    _1  (1.854 m)  Weight:    99.791 kg (220 lb)  SpO2:  100% 100% 100%     General:  Adult male, sunken eyes, tearful, NAD  Eyes:  PERRL, anicteric, non-injected.  ENT:  Nares clear.  OP clear, non-erythematous without plaques or exudates.  Dry MM, no obvious thrush  Neck:  Supple without TM or JVD.    Lymph:  No cervical, supraclavicular, or submandibular LAD.  Cardiovascular:  Tachycardic, RR, normal S1, S2, 3/6 systolic murmur at the LSB.  2+ pulses, warm extremities  Respiratory:   CTAB, without increased WOB.  Abdomen:  NABS.  Soft, ND/NT.    Skin:  No rashes or focal lesions.  Musculoskeletal:  Normal bulk and tone.  No LE edema.  Psychiatric:  A & O x 4.  Appropriate affect.  Neurologic:  CN 3-12 intact.  5/5 strength.  Sensation intact.  Labs on Admission:  Basic Metabolic Panel:  Recent Labs Lab 03/04/15 1354  NA 134*  K 5.6*  CL 98*  CO2 6*  GLUCOSE 940*  BUN 69*  CREATININE 2.63*  CALCIUM 9.8   Liver Function  Tests:  Recent Labs Lab 03/04/15 1354  AST 21  ALT 30  ALKPHOS 103  BILITOT 2.3*  PROT 7.8  ALBUMIN 4.3   No results for input(s): LIPASE, AMYLASE in the last 168 hours. No results for input(s): AMMONIA in the last 168 hours. CBC:  Recent Labs Lab 03/04/15 1354 03/04/15 1838  WBC 15.0* 16.1*  NEUTROABS  --  13.4*  HGB 13.8 11.8*  HCT 41.4 36.0*  MCV 76.2* 77.4*  PLT 235 248   Cardiac Enzymes: No results for input(s): CKTOTAL, CKMB, CKMBINDEX, TROPONINI in the last 168 hours.  BNP (last 3 results) No results for input(s): BNP in the last 8760 hours.  ProBNP (last 3 results) No results for input(s): PROBNP in the last 8760 hours.  CBG:  Recent Labs Lab 03/04/15 1303 03/04/15 1427 03/04/15 1534 03/04/15 1652  GLUCAP >600* >600* >600* >600*    Radiological Exams on Admission: Dg Chest 2 View  03/04/2015   CLINICAL DATA:  Shortness of breath for 1 day  EXAM: CHEST  2 VIEW  COMPARISON:  August 16, 2014  FINDINGS: There is no edema or consolidation. The heart size and pulmonary vascularity are normal. No adenopathy. No bone lesions.  IMPRESSION: No edema or consolidation.   Electronically Signed   By: Lowella Grip III M.D.   On: 03/04/2015 14:13    Tele:  Sinus tachycardia.  ECG pending.  Assessment/Plan Active Problems:   Diabetes mellitus   DKA (diabetic ketoacidoses)   Acute kidney injury   Hyperkalemia   Leukocytosis   Nausea and vomiting  ---  Diabetes mellitus type 2 with diabetic ketoacidosis and severe dehydration (probably a little overlap with HHS), likely triggered by insulin noncompliance and gastroenteritis.  Calculated serum osmolality to be 345.  Initial bicarb was 6, pH 7.11, gap 30.   -  Additional NS bolus for a total of 4L in the first 4 hours, then 236m/h NS -  Allow patient to also drink water -  Insulin gtt -  BMP q4h with electrolyte replacement as needed  Nausea, vomiting, and diarrhea, some symptoms are likely due to DKA,  but may have food poisoning or gastroenteritis -  IVF -  Antiemetics -  Enteric precautions with stool culture and C. Diff PCR  Hematemesis, likely due to mallory weiss or gastritis -  Start protonix IV BID  Corrected sodium is 147 -  Continue normal saline until dehydration resolved, then transition to hypotonic fluids for sodium correction as needed  AKI due to dehydration, creatinine 2.63 with baseline 0.8 -  Trend creatinine -  IVF  Leukocytosis and lactic acid with sinus tachycardia likely due to dehydration -  IVF and trend WBC, lactic acid and HR  SIRS criteria met, although I think this is much less likely and he has been afebrile.  Could have dental or intraabdominal infections.   -  Blood cultures -  UA and CXR neg -  Zosyn, but I would stop this as soon as possible  Inability to afford medications -  CM consultation  Diet:  NPO except for water and ice chips Access:  PIV IVF:  yes Proph:  lovenox  Code Status: full Family Communication: patient alone Disposition Plan: Admit to stepdown  Time spent: 60 min SJanece [facility] Pager [phone] If 7PM-7AM, please contact night-coverage [email or web address] Password [email or web address] 03/04/2015, 7:06 PM

## 2015-03-04 NOTE — Progress Notes (Signed)
ANTIBIOTIC CONSULT NOTE - INITIAL  Pharmacy Consult for Zosyn Indication: IAI  No Known Allergies  Patient Measurements: Height:  (185.4 cm) Weight: 220 lb (99.791 kg) IBW/kg (Calculated) : 79.9 Adjusted Body Weight:   Vital Signs: Temp: 97.5 F (36.4 C) (08/03 1259) Temp Source: Oral (08/03 1259) BP: 129/82 mmHg (08/03 1800) Pulse Rate: 110 (08/03 1800) Intake/Output from previous day:   Intake/Output from this shift:    Labs:  Recent Labs  03/04/15 1354  WBC 15.0*  HGB 13.8  PLT 235  CREATININE 2.63*   Estimated Creatinine Clearance: 43.6 mL/min (by C-G formula based on Cr of 2.63). No results for input(s): VANCOTROUGH, VANCOPEAK, VANCORANDOM, GENTTROUGH, GENTPEAK, GENTRANDOM, TOBRATROUGH, TOBRAPEAK, TOBRARND, AMIKACINPEAK, AMIKACINTROU, AMIKACIN in the last 72 hours.   Microbiology: No results found for this or any previous visit (from the past 720 hour(s)).  Medical History: Past Medical History  Diagnosis Date  . Hypertension   . Diabetes mellitus   . Sinusitis 08/18/2014  . DKA (diabetic ketoacidoses) 08/15/2014     Assessment: 54 yoM presents with 2 days of vomiting/diarrhea, elevated WBC, AKI and DKA.   Pharmacy consulted to dose Zosyn for possible intra-abdominal infection.    Afebrile WBC 15  SCr 2.62, CrCl~49 ml/min  Blood culture collected.  Cdiff and stool cultures ordered.  Goal of Therapy:  Doses adjusted per renal function Eradication of infection  Plan:  Zosyn 3.375g IV q8h (4 hour infusion time).  F/u SCr, culture results, clinical course.  Clance Boll 03/04/2015,6:04 PM

## 2015-03-04 NOTE — ED Notes (Signed)
Blood draw unsuccessful 

## 2015-03-04 NOTE — Progress Notes (Signed)
Utilization Review completed.  Kyen Taite RN CM  

## 2015-03-05 DIAGNOSIS — G43A0 Cyclical vomiting, not intractable: Secondary | ICD-10-CM

## 2015-03-05 DIAGNOSIS — E1310 Other specified diabetes mellitus with ketoacidosis without coma: Principal | ICD-10-CM

## 2015-03-05 LAB — BASIC METABOLIC PANEL
Anion gap: 7 (ref 5–15)
Anion gap: 5 (ref 5–15)
BUN: 48 mg/dL — AB (ref 6–20)
BUN: 40 mg/dL — ABNORMAL HIGH (ref 6–20)
CHLORIDE: 119 mmol/L — AB (ref 101–111)
CO2: 19 mmol/L — ABNORMAL LOW (ref 22–32)
CO2: 20 mmol/L — ABNORMAL LOW (ref 22–32)
CREATININE: 1.41 mg/dL — AB (ref 0.61–1.24)
CREATININE: 1.08 mg/dL (ref 0.61–1.24)
Calcium: 9.1 mg/dL (ref 8.9–10.3)
Calcium: 8.7 mg/dL — ABNORMAL LOW (ref 8.9–10.3)
Chloride: 117 mmol/L — ABNORMAL HIGH (ref 101–111)
GFR calc non Af Amer: 58 mL/min — ABNORMAL LOW (ref >60)
GFR calc non Af Amer: >60 mL/min (ref >60)
Glucose, Bld: 239 mg/dL — ABNORMAL HIGH (ref 65–99)
Glucose, Bld: 153 mg/dL — ABNORMAL HIGH (ref 65–99)
Potassium: 3.6 mmol/L (ref 3.5–5.1)
Potassium: 3.7 mmol/L (ref 3.5–5.1)
SODIUM: 143 mmol/L (ref 135–145)
Sodium: 144 mmol/L (ref 135–145)

## 2015-03-05 LAB — GLUCOSE, CAPILLARY
GLUCOSE-CAPILLARY: 133 mg/dL — AB (ref 65–99)
GLUCOSE-CAPILLARY: 141 mg/dL — AB (ref 65–99)
GLUCOSE-CAPILLARY: 142 mg/dL — AB (ref 65–99)
GLUCOSE-CAPILLARY: 166 mg/dL — AB (ref 65–99)
GLUCOSE-CAPILLARY: 181 mg/dL — AB (ref 65–99)
GLUCOSE-CAPILLARY: 238 mg/dL — AB (ref 65–99)
GLUCOSE-CAPILLARY: 271 mg/dL — AB (ref 65–99)
GLUCOSE-CAPILLARY: 288 mg/dL — AB (ref 65–99)
Glucose-Capillary: 213 mg/dL — ABNORMAL HIGH (ref 65–99)
Glucose-Capillary: 207 mg/dL — ABNORMAL HIGH (ref 65–99)
Glucose-Capillary: 142 mg/dL — ABNORMAL HIGH (ref 65–99)
Glucose-Capillary: 160 mg/dL — ABNORMAL HIGH (ref 65–99)
Glucose-Capillary: 289 mg/dL — ABNORMAL HIGH (ref 65–99)
Glucose-Capillary: 237 mg/dL — ABNORMAL HIGH (ref 65–99)

## 2015-03-05 LAB — RENAL FUNCTION PANEL
ANION GAP: 6 (ref 5–15)
Albumin: 3.5 g/dL (ref 3.5–5.0)
BUN: 38 mg/dL — AB (ref 6–20)
CO2: 20 mmol/L — AB (ref 22–32)
CREATININE: 1.16 mg/dL (ref 0.61–1.24)
Calcium: 8.8 mg/dL — ABNORMAL LOW (ref 8.9–10.3)
Chloride: 118 mmol/L — ABNORMAL HIGH (ref 101–111)
GFR calc non Af Amer: >60 mL/min (ref >60)
GLUCOSE: 154 mg/dL — AB (ref 65–99)
POTASSIUM: 3.6 mmol/L (ref 3.5–5.1)
Phosphorus: <1 mg/dL — CL (ref 2.5–4.6)
Sodium: 144 mmol/L (ref 135–145)

## 2015-03-05 LAB — CBC
HCT: 31.9 % — ABNORMAL LOW (ref 39.0–52.0)
Hemoglobin: 10.9 g/dL — ABNORMAL LOW (ref 13.0–17.0)
MCH: 25.6 pg — AB (ref 26.0–34.0)
MCHC: 34.2 g/dL (ref 30.0–36.0)
MCV: 74.9 fL — AB (ref 78.0–100.0)
Platelets: 200 10*3/uL (ref 150–400)
RBC: 4.26 MIL/uL (ref 4.22–5.81)
RDW: 15.4 % (ref 11.5–15.5)
WBC: 11.7 10*3/uL — AB (ref 4.0–10.5)

## 2015-03-05 LAB — MAGNESIUM: Magnesium: 2.4 mg/dL (ref 1.7–2.4)

## 2015-03-05 LAB — LACTIC ACID, PLASMA
LACTIC ACID, VENOUS: 0.9 mmol/L (ref 0.5–2.0)
Lactic Acid, Venous: 1 mmol/L (ref 0.5–2.0)

## 2015-03-05 LAB — MRSA PCR SCREENING: MRSA by PCR: NEGATIVE

## 2015-03-05 MED ORDER — INSULIN GLARGINE 100 UNIT/ML ~~LOC~~ SOLN
30.0000 [IU] | Freq: Every day | SUBCUTANEOUS | Status: DC
  Administered 2015-03-05: 30 [IU] via SUBCUTANEOUS
  Filled 2015-03-05 (×2): qty 0.3

## 2015-03-05 MED ORDER — INSULIN ASPART 100 UNIT/ML ~~LOC~~ SOLN
0.0000 [IU] | Freq: Every day | SUBCUTANEOUS | Status: DC
  Administered 2015-03-05: 2 [IU] via SUBCUTANEOUS

## 2015-03-05 MED ORDER — INSULIN ASPART 100 UNIT/ML ~~LOC~~ SOLN
0.0000 [IU] | Freq: Three times a day (TID) | SUBCUTANEOUS | Status: DC
  Administered 2015-03-05: 8 [IU] via SUBCUTANEOUS
  Administered 2015-03-05: 3 [IU] via SUBCUTANEOUS
  Administered 2015-03-06: 8 [IU] via SUBCUTANEOUS

## 2015-03-05 MED ORDER — PANTOPRAZOLE SODIUM 40 MG PO TBEC
40.0000 mg | DELAYED_RELEASE_TABLET | Freq: Every day | ORAL | Status: DC
  Administered 2015-03-06: 40 mg via ORAL
  Filled 2015-03-05: qty 1

## 2015-03-05 NOTE — Progress Notes (Signed)
Inpatient Diabetes Program Recommendations  AACE/ADA: New Consensus Statement on Inpatient Glycemic Control (2013)  Target Ranges:  Prepandial:   less than 140 mg/dL      Peak postprandial:   less than 180 mg/dL (1-2 hours)      Critically ill patients:  140 - 180 mg/dL   Attempted to speak with patient regarding the [employer] savings cards for both Toujeo (lantus-like, see previous note) and Apidra insulins.  Pt sleeping and did not awake when I addressed him in his room.  Did not want to awaken him yet. Called Case Mgr., Marcelle Smiling regarding using these savings cards as well. She mentioned that [facility] and some of the other pharmacies now have lantus available at $4.00.  I would assume this is the generic lantus which is fine as well.; Still have the rapid-acting insulin to obtain which again the Apidra savings card allows him to get this rapid-acting insulin (like Novolog and Humalog) at no cost for a year. Will talk with patient when awake and alert. Will check with [facility] as well for availability to patients.  Thank you Lenor Coffin, RN, MSN, CDE  Diabetes Inpatient Program Office: [phone] Pager: [phone] 8:00 am to 5:00 pm

## 2015-03-05 NOTE — Progress Notes (Addendum)
[facility] PROGRESS NOTE  Seth Cruz ZOX:[id number] DOB: [birth year] DOA: 03/04/2015 PCP: [name], MD  brief narrative 44 year old male with history of uncontrolled type 2 diabetes mellitus, hypertension, prior history of lung abscess presented with nausea, vomiting and diarrhea. Patient has a lot of financial stress with difficulty affording rent, food and he is insulin. He ran out of his long-acting insulin 5 days prior to admission and was only using short-acting insulin. His blood sugars were high at home (in the 300s). He was having nausea with vomiting since 3 days prior to admission along with watery diarrhea. Patient on admission was septic with tachycardia, significant lactic and metabolic acidosis with severe DKA. Patient given IV normal saline bolus and started on insulin drip. Admitted to stepdown.   Assessment/Plan: Type 2 diabetes mellitus with severe DKA Likely in the setting of missing his insulin and underlying gastroenteritis. Patient hydrated aggressively with normal saline and placed on insulin drip. Anion gap has now closed and I ordered for 30 units of Lantus and stop insulin drip after an overlap of 2 hours. Monitor FSG and place on sliding scale insulin. This manager consulted to assist with his insulin. Seen by diabetic coordinator who recommended on toujeo insulin which has very little co-pay if patient has no government insurance. Otherwise he would likely be discharged on NPH 70-30. -Check A1c. -Transfer to medical floor.  Sepsis Likely in the setting of DKA and acute viral gastroenteritis. Symptoms improving with IV fluids, antiemetics. No further diarrhea since admission so enteric  precautions were discontinued. On empiric Zosyn . Given resolution of sepsis I will discontinue further antibiotics and monitor. All up blood cultures sent on admission.  Nausea, vomiting and diarrhea Likely associated with DKA and gastroenteritis. Continue supportive  care. No fever , leukocytosis and GI symptoms improved. Will discontinue antibiotics.  Hematemesis Possibly due to Mallory-Weiss with ongoing vomiting. H&H stable. Continue Protonix.  Acute on kidney injury Secondary to severe dehydration. Resolved with aggressive hydration.  DVT prophylaxis: Subcutaneous heparin  Diet: Diabetic   Code Status: Full code Family Communication: None at bedside Disposition Plan: Home possibly tomorrow   Consultants:  None  Procedures:  None  Antibiotics:  IV Zosyn 8/3-8/4  Ciprofloxacin and Flagyl 8/4--  HPI/Subjective: Inpatient seen and examined. Reported feeling nauseous early in the morning but denies further nausea or vomiting. No diarrhea since admission. Anion gap closed this morning.  Objective: Filed Vitals:   03/05/15 0815  BP: 132/84  Pulse: 95  Temp:   Resp: 15    Intake/Output Summary (Last 24 hours) at 03/05/15 1136 Last data filed at 03/05/15 0900  Gross per 24 hour  Intake 6541.45 ml  Output   1150 ml  Net 5391.45 ml   Filed Weights   03/04/15 1800 03/05/15 0500  Weight: 99.791 kg (220 lb) 91.5 kg (201 lb 11.5 oz)    Exam:   General:  Middle aged male in no acute distress, appears fatigued  HEENT: No pallor, moist oral mucosa, supple neck  Chest: Clear to auscultation bilaterally    CVS: Normal S1 and S2, no murmurs rubs or gallop  GI: Soft, nondistended, nontender, bowel sounds present  Musculoskeletal: Warm, no edema  CNS: Alert and oriented    Data Reviewed: Basic Metabolic Panel:  Recent Labs Lab 03/04/15 1354 03/04/15 1838 03/04/15 2206 03/05/15 0140 03/05/15 0635  NA 134* 137 142 143 144  144  K 5.6* 4.3 3.9 3.6 3.6  3.7  CL 98* 109 114* 117* 118*  119*  CO2 6* 9* 14* 19* 20*  20*  GLUCOSE 940* 689* 406* 239* 154*  153*  BUN 69* 69* 59* 48* 38*  40*  CREATININE 2.63* 2.23* 1.60* 1.41* 1.16  1.08  CALCIUM 9.8 8.6* 9.0 9.1 8.8*  8.7*  MG  --   --   --   --  2.4  PHOS   --   --   --   --  <1.0*   Liver Function Tests:  Recent Labs Lab 03/04/15 1354 03/04/15 1838 03/05/15 0635  AST 21 21  --   ALT 30 27  --   ALKPHOS 103 86  --   BILITOT 2.3* 1.8*  --   PROT 7.8 7.0  --   ALBUMIN 4.3 3.7 3.5   No results for input(s): LIPASE, AMYLASE in the last 168 hours. No results for input(s): AMMONIA in the last 168 hours. CBC:  Recent Labs Lab 03/04/15 1354 03/04/15 1838 03/05/15 0635  WBC 15.0* 16.1* 11.7*  NEUTROABS  --  13.4*  --   HGB 13.8 11.8* 10.9*  HCT 41.4 36.0* 31.9*  MCV 76.2* 77.4* 74.9*  PLT 235 248 200   Cardiac Enzymes: No results for input(s): CKTOTAL, CKMB, CKMBINDEX, TROPONINI in the last 168 hours. BNP (last 3 results) No results for input(s): BNP in the last 8760 hours.  ProBNP (last 3 results) No results for input(s): PROBNP in the last 8760 hours.  CBG:  Recent Labs Lab 03/05/15 0237 03/05/15 0342 03/05/15 0442 03/05/15 0547 03/05/15 0737  GLUCAP 207* 166* 133* 141* 142*    Recent Results (from the past 240 hour(s))  MRSA PCR Screening     Status: None   Collection Time: 03/04/15  5:55 PM  Result Value Ref Range Status   MRSA by PCR NEGATIVE NEGATIVE Final    Comment:        The GeneXpert MRSA Assay (FDA approved for NASAL specimens only), is one component of a comprehensive MRSA colonization surveillance program. It is not intended to diagnose MRSA infection nor to guide or monitor treatment for MRSA infections. Performed at [facility]      Studies: Dg Chest 2 View  03/04/2015   CLINICAL DATA:  Shortness of breath for 1 day  EXAM: CHEST  2 VIEW  COMPARISON:  August 16, 2014  FINDINGS: There is no edema or consolidation. The heart size and pulmonary vascularity are normal. No adenopathy. No bone lesions.  IMPRESSION: No edema or consolidation.   Electronically Signed   By: Bretta Bang III M.D.   On: 03/04/2015 14:13    Scheduled Meds: . enoxaparin (LOVENOX) injection  40 mg  Subcutaneous Q24H  . insulin aspart  0-15 Units Subcutaneous TID WC  . insulin aspart  0-5 Units Subcutaneous QHS  . insulin glargine  30 Units Subcutaneous Daily  . insulin regular  0-10 Units Intravenous TID WC  . pantoprazole (PROTONIX) IV  40 mg Intravenous BID  . piperacillin-tazobactam (ZOSYN)  IV  3.375 g Intravenous Q8H   Continuous Infusions: . sodium chloride Stopped (03/05/15 0045)  . dextrose 5 % and 0.45% NaCl 125 mL/hr at 03/05/15 0900  . insulin (NOVOLIN-R) infusion 6.6 Units/hr (03/05/15 0900)      Time spent: 25 minutes    Seth Cruz  [facility] Pager [phone]. If 7PM-7AM, please contact night-coverage at [email or web address], password [facility] 03/05/2015, 11:36 AM  LOS: 1 day

## 2015-03-05 NOTE — Progress Notes (Signed)
CSW consulted for assistance with financial resources. Pt was sleeping soundly when CSW attempted to visit. CSW will return to offer assistance.  Cori Razor LCSW [phone]

## 2015-03-05 NOTE — Progress Notes (Addendum)
Inpatient Diabetes Program Recommendations  AACE/ADA: New Consensus Statement on Inpatient Glycemic Control (2013)  Target Ranges:  Prepandial:   less than 140 mg/dL      Peak postprandial:   less than 180 mg/dL (1-2 hours)      Critically ill patients:  140 - 180 mg/dL   Consult received. Pt recent admission in January of 2016 with similar circumstances. If patient has [employer], he qualifies for [employer] insulin  discunt card as well as Apidra discount-(Apidra is the rapid acting insulin from [employer]-same properties and actions of novolog and humalog). Toujeo is a basal insulin similar to Lantus that is dosed by a pen. The conversion dose from lantus to Toujeo is a 1:1 unit conversion-35 units once daily or HS. The savings using these cards reduces the co-pay to no more than $15.00 per month for a year. Will speak with patient to assure there is no government insurance that patient is receiving. Pt was consulted on diabetes self-management on last admission in Jan by Diab Coordinator, but will assess knowledge deficits as well.  Thank you Lenor Coffin, RN, MSN, CDE  Diabetes Inpatient Program Office: [phone] Pager: [phone] 8:00 am to 5:00 pm

## 2015-03-05 NOTE — Progress Notes (Signed)
Received report from ICU, Pt arrived unit , alert and oriented, able to communicate needs. MD notified of Pt's location. Will continue with current plan of care.

## 2015-03-05 NOTE — Progress Notes (Signed)
Inpatient Diabetes Program Recommendations  AACE/ADA: New Consensus Statement on Inpatient Glycemic Control (2013)  Target Ranges:  Prepandial:   less than 140 mg/d      Peak postprandial:   less than 180 mg/dL (1-2 hours)      Critically ill patients:  140 - 180 mg/dL   Attempted again to speak with patient in room. Pt states he is 'so tired and nauseous'.  He states he does not want to eat yet as 'the smell of food makes him sick'. While patient is unable to eat, please consider using the moderate correction scale q 4 hrs rather than tidwc  Will see patient again in am.  Thank you Lenor Coffin, RN, MSN, CDE  Diabetes Inpatient Program Office: [phone] Pager: [phone] 8:00 am to 5:00 pm

## 2015-03-06 DIAGNOSIS — I10 Essential (primary) hypertension: Secondary | ICD-10-CM

## 2015-03-06 DIAGNOSIS — A419 Sepsis, unspecified organism: Secondary | ICD-10-CM | POA: Diagnosis present

## 2015-03-06 DIAGNOSIS — E1110 Type 2 diabetes mellitus with ketoacidosis without coma: Secondary | ICD-10-CM | POA: Diagnosis present

## 2015-03-06 DIAGNOSIS — N179 Acute kidney failure, unspecified: Secondary | ICD-10-CM

## 2015-03-06 LAB — BASIC METABOLIC PANEL
Anion gap: 7 (ref 5–15)
BUN: 23 mg/dL — ABNORMAL HIGH (ref 6–20)
CALCIUM: 8.5 mg/dL — AB (ref 8.9–10.3)
CO2: 20 mmol/L — ABNORMAL LOW (ref 22–32)
CREATININE: 1.02 mg/dL (ref 0.61–1.24)
Chloride: 115 mmol/L — ABNORMAL HIGH (ref 101–111)
GFR calc Af Amer: >60 mL/min (ref >60)
GFR calc non Af Amer: >60 mL/min (ref >60)
Glucose, Bld: 287 mg/dL — ABNORMAL HIGH (ref 65–99)
Potassium: 3.8 mmol/L (ref 3.5–5.1)
SODIUM: 142 mmol/L (ref 135–145)

## 2015-03-06 LAB — HEMOGLOBIN A1C
Hgb A1c MFr Bld: 13.5 % — ABNORMAL HIGH (ref 4.8–5.6)
Mean Plasma Glucose: 341 mg/dL

## 2015-03-06 LAB — GLUCOSE, CAPILLARY: GLUCOSE-CAPILLARY: 277 mg/dL — AB (ref 65–99)

## 2015-03-06 MED ORDER — INSULIN NPH (HUMAN) (ISOPHANE) 100 UNIT/ML ~~LOC~~ SUSP: 30.0000 [IU] | Freq: Two times a day (BID) | SUBCUTANEOUS | Status: DC

## 2015-03-06 MED ORDER — GLUCOSE BLOOD VI STRP: ORAL_STRIP | Status: DC

## 2015-03-06 MED ORDER — "INSULIN SYRINGE 28G X 1/2"" 1 ML MISC": 1.0000 "application " | Freq: Two times a day (BID) | Status: DC

## 2015-03-06 MED ORDER — ACCU-CHEK MULTICLIX LANCETS MISC
Status: DC
Start: 2015-03-06 — End: 2016-04-05

## 2015-03-06 MED ORDER — LISINOPRIL 20 MG PO TABS: 20.0000 mg | ORAL_TABLET | Freq: Every day | ORAL | Status: DC

## 2015-03-06 MED ORDER — INSULIN STARTER KIT- SYRINGES (ENGLISH)
1.0000 | Freq: Once | Status: DC
  Filled 2015-03-06: qty 1

## 2015-03-06 MED ORDER — INSULIN NPH (HUMAN) (ISOPHANE) 100 UNIT/ML ~~LOC~~ SUSP
25.0000 [IU] | Freq: Two times a day (BID) | SUBCUTANEOUS | Status: DC
  Filled 2015-03-06: qty 10

## 2015-03-06 NOTE — Progress Notes (Signed)
Inpatient Diabetes Program Recommendations  AACE/ADA: New Consensus Statement on Inpatient Glycemic Control (2013)  Target Ranges:  Prepandial:   less than 140 mg/dL      Peak postprandial:   less than 180 mg/dL (1-2 hours)      Critically ill patients:  140 - 180 mg/dL   Pt discharged on NPH with vial and syringe. Pt has been using a pen for several years. Reviewed with pt and his wife how to use the vial of NPH with concentration to roll the bottle before use. Pt used starter kit and watched the videos. Reviewed HgbA1C information, cho counting. Pt has appt with his primary on Monday.  Thank you Rosita Kea, RN, MSN, CDE  Diabetes Inpatient Program Office: [phone] Pager: [phone] 8:00 am to 5:00 pm

## 2015-03-06 NOTE — Progress Notes (Signed)
[id number] [name],RN,BSN,CCM:  Pharmacy discount card given to patient with instruction on how to use/patient has reg [employer] insurance/meds ocst estimated at $4:00/vial of nph insulin.

## 2015-03-06 NOTE — Discharge Summary (Signed)
Physician Discharge Summary  [place] ZOX:[id number] DOB: [birth year] DOA: 03/04/2015  PCP: Seth Aliment, MD  Admit date: 03/04/2015 Discharge date: 03/06/2015  Time spent: 35 minutes  Recommendations for Outpatient Follow-up:  1. Discharge home with outpatient follow-up with PCP. Reports having appointment on 8/15. 2. Patient being discharged on NPH 30 units twice a day with meals due to affordability. Diabetic supplies prescribed. Needs  Discharge Diagnoses:  Principle problem   DM (diabetes mellitus) type 2, uncontrolled, with ketoacidosis   Active Problems:   Sepsis   Hypertension   Acute kidney injury   Hyperkalemia   Leukocytosis   Acute gastroenteritis, viral     Discharge Condition: Fair  Diet recommendation: Diabetic  Filed Weights   03/04/15 1800 03/05/15 0500 03/06/15 0519  Weight: 99.791 kg (220 lb) 91.5 kg (201 lb 11.5 oz) 93.305 kg (205 lb 11.2 oz)    History of present illness:  Please refer to admission H&P for details, in brief, 44 year old male with history of uncontrolled type 2 diabetes mellitus, hypertension, prior history of lung abscess presented with nausea, vomiting and diarrhea. Patient has a lot of financial stress with difficulty affording rent, food and he is insulin. He ran out of his long-acting insulin 5 days prior to admission and was only using short-acting insulin. His blood sugars were high at home (in the 300s). He was having nausea with vomiting since 3 days prior to admission along with watery diarrhea. Patient on admission was septic with tachycardia, significant lactic and metabolic acidosis with severe DKA. Patient given IV normal saline bolus and started on insulin drip. Admitted to stepdown.  Hospital Course:  Type 2 diabetes mellitus with severe DKA Likely in the setting of missing his insulin and underlying gastroenteritis. Patient hydrated aggressively with normal saline and placed on insulin drip. Once an and gap closely  was placed on 30 units of Lantus and insulin drip discontinued. Fingersticks are in the range of 230-280. Case manager consulted to assist with insulin requirements. -A1c of 13.5. -Given the last and affordability I will discharge him on NPH 70/30, 30 units twice daily. Patient also provided with prescription for test strips and lancets . (will also provide some alcohol swab and gauze). -Patient instructed to keep the log of his fingerstick monitoring and showed to his PCP. Insulin dose can be adjusted as outpatient. -Patient's symptoms have resolved.   Sepsis Likely in the setting of DKA and acute viral gastroenteritis. Symptoms resolved with IV fluids and antiemetics. -Blood cultures on admission are negative. No further nausea or vomiting although reports poor appetite this morning. -Does not need further antibiotics.    Nausea, vomiting and diarrhea Likely associated with DKA and gastroenteritis. Improved with supportive care. No fever , leukocytosis and GI symptoms improved.  discontinued antibiotics.  Hematemesis Possibly due to Mallory-Weiss with ongoing vomiting. H&H stable. Received Protonix while in the hospital. No further symptoms.  Acute on kidney injury Secondary to severe dehydration. Resolved with aggressive hydration.  Essential hypertension Will discharge him on lisinopril.  Diet: Diabetic   Code Status: Full code Family Communication: Wife at bedside Disposition Plan: Home    Consultants:  None  Procedures:  None  Antibiotics:  IV Zosyn 8/3-8/4  Ciprofloxacin and Flagyl 8/4--8/5  Discharge Exam: Filed Vitals:   03/06/15 0519  BP: 159/93  Pulse: 96  Temp: 98.3 F (36.8 C)  Resp: 18    General: Middle aged male in no acute distress HEENT: No pallor, moist oral mucosa Chest: Clear to auscultation  bilaterally CVS: Normal S1 and S2, no murmurs rub or gallop GI: Soft, nondistended, nontender, bowel sounds present Musculoskeletal: Warm, no  edema CNS: Alert and oriented   Discharge Instructions    Current Discharge Medication List    START taking these medications   Details  glucose blood test strip Use as instructed Qty: 100 each, Refills: 12    insulin NPH Human (HUMULIN N,NOVOLIN N) 100 UNIT/ML injection Inject 0.3 mLs (30 Units total) into the skin 2 (two) times daily before a meal. Qty: 10 mL, Refills: 11    Lancets (ACCU-CHEK MULTICLIX) lancets Use as instructed Qty: 100 each, Refills: 12    lisinopril (PRINIVIL,ZESTRIL) 20 MG tablet Take 1 tablet (20 mg total) by mouth daily. Qty: 30 tablet, Refills: 0      STOP taking these medications     insulin aspart (NOVOLOG) 100 UNIT/ML injection      insulin detemir (LEVEMIR) 100 UNIT/ML injection        No Known Allergies Follow-up Information    Follow up with Seth Aliment, MD On 03/16/2015.   Specialty:  Internal Medicine   Contact information:   [address] [place] [address] [phone]        The results of significant diagnostics from this hospitalization (including imaging, microbiology, ancillary and laboratory) are listed below for reference.    Significant Diagnostic Studies: Dg Chest 2 View  03/04/2015   CLINICAL DATA:  Shortness of breath for 1 day  EXAM: CHEST  2 VIEW  COMPARISON:  August 16, 2014  FINDINGS: There is no edema or consolidation. The heart size and pulmonary vascularity are normal. No adenopathy. No bone lesions.  IMPRESSION: No edema or consolidation.   Electronically Signed   By: Bretta Bang III M.D.   On: 03/04/2015 14:13    Microbiology: Recent Results (from the past 240 hour(s))  MRSA PCR Screening     Status: None   Collection Time: 03/04/15  5:55 PM  Result Value Ref Range Status   MRSA by PCR NEGATIVE NEGATIVE Final    Comment:        The GeneXpert MRSA Assay (FDA approved for NASAL specimens only), is one component of a comprehensive MRSA colonization surveillance program. It is  not intended to diagnose MRSA infection nor to guide or monitor treatment for MRSA infections. Performed at [facility]   Culture, blood (x 2)     Status: None (Preliminary result)   Collection Time: 03/04/15  6:30 PM  Result Value Ref Range Status   Specimen Description BLOOD RIGHT ARM  Final   Special Requests IN PEDIATRIC BOTTLE 4CC  Final   Culture   Final    NO GROWTH < 24 HOURS Performed at [facility]    Report Status PENDING  Incomplete  Culture, blood (x 2)     Status: None (Preliminary result)   Collection Time: 03/04/15  6:50 PM  Result Value Ref Range Status   Specimen Description BLOOD RIGHT HAND  Final   Special Requests BOTTLES DRAWN AEROBIC ONLY 10CC  Final   Culture   Final    NO GROWTH < 24 HOURS Performed at [facility]    Report Status PENDING  Incomplete     Labs: Basic Metabolic Panel:  Recent Labs Lab 03/04/15 1838 03/04/15 2206 03/05/15 0140 03/05/15 0635 03/06/15 0513  NA 137 142 143 144  144 142  K 4.3 3.9 3.6 3.6  3.7 3.8  CL 109 114* 117*  118*  119* 115*  CO2 9* 14* 19* 20*  20* 20*  GLUCOSE 689* 406* 239* 154*  153* 287*  BUN 69* 59* 48* 38*  40* 23*  CREATININE 2.23* 1.60* 1.41* 1.16  1.08 1.02  CALCIUM 8.6* 9.0 9.1 8.8*  8.7* 8.5*  MG  --   --   --  2.4  --   PHOS  --   --   --  <1.0*  --    Liver Function Tests:  Recent Labs Lab 03/04/15 1354 03/04/15 1838 03/05/15 0635  AST 21 21  --   ALT 30 27  --   ALKPHOS 103 86  --   BILITOT 2.3* 1.8*  --   PROT 7.8 7.0  --   ALBUMIN 4.3 3.7 3.5   No results for input(s): LIPASE, AMYLASE in the last 168 hours. No results for input(s): AMMONIA in the last 168 hours. CBC:  Recent Labs Lab 03/04/15 1354 03/04/15 1838 03/05/15 0635  WBC 15.0* 16.1* 11.7*  NEUTROABS  --  13.4*  --   HGB 13.8 11.8* 10.9*  HCT 41.4 36.0* 31.9*  MCV 76.2* 77.4* 74.9*  PLT 235 248 200   Cardiac Enzymes: No results for input(s): CKTOTAL, CKMB, CKMBINDEX,  TROPONINI in the last 168 hours. BNP: BNP (last 3 results) No results for input(s): BNP in the last 8760 hours.  ProBNP (last 3 results) No results for input(s): PROBNP in the last 8760 hours.  CBG:  Recent Labs Lab 03/05/15 1127 03/05/15 1509 03/05/15 1638 03/05/15 2049 03/06/15 0741  GLUCAP 181* 289* 271* 237* 277*       Signed:  Maryam Feely  [facility] 03/06/2015, 9:42 AM

## 2015-03-06 NOTE — Discharge Instructions (Signed)

## 2015-03-09 LAB — CULTURE, BLOOD (ROUTINE X 2)
CULTURE: NO GROWTH
Culture: NO GROWTH

## 2016-04-02 ENCOUNTER — Inpatient Hospital Stay (HOSPITAL_COMMUNITY): Payer: 59

## 2016-04-02 ENCOUNTER — Encounter (HOSPITAL_COMMUNITY): Payer: Self-pay

## 2016-04-02 ENCOUNTER — Inpatient Hospital Stay (HOSPITAL_COMMUNITY)
Admission: EM | Admit: 2016-04-02 | Discharge: 2016-04-05 | DRG: 637 | Disposition: A | Discharge status: Home / Self Care | Payer: 59 | Attending: Internal Medicine | Admitting: Internal Medicine

## 2016-04-02 DIAGNOSIS — Z794 Long term (current) use of insulin: Secondary | ICD-10-CM

## 2016-04-02 DIAGNOSIS — E083219 Diabetes mellitus due to underlying condition with mild nonproliferative diabetic retinopathy with macular edema, unspecified eye: Secondary | ICD-10-CM

## 2016-04-02 DIAGNOSIS — E870 Hyperosmolality and hypernatremia: Secondary | ICD-10-CM | POA: Diagnosis not present

## 2016-04-02 DIAGNOSIS — E860 Dehydration: Secondary | ICD-10-CM | POA: Diagnosis present

## 2016-04-02 DIAGNOSIS — N179 Acute kidney failure, unspecified: Secondary | ICD-10-CM | POA: Diagnosis present

## 2016-04-02 DIAGNOSIS — I951 Orthostatic hypotension: Secondary | ICD-10-CM | POA: Diagnosis present

## 2016-04-02 DIAGNOSIS — I10 Essential (primary) hypertension: Secondary | ICD-10-CM | POA: Diagnosis present

## 2016-04-02 DIAGNOSIS — Z79899 Other long term (current) drug therapy: Secondary | ICD-10-CM | POA: Diagnosis not present

## 2016-04-02 DIAGNOSIS — Z87891 Personal history of nicotine dependence: Secondary | ICD-10-CM | POA: Diagnosis not present

## 2016-04-02 DIAGNOSIS — R531 Weakness: Secondary | ICD-10-CM | POA: Diagnosis present

## 2016-04-02 DIAGNOSIS — K2970 Gastritis, unspecified, without bleeding: Secondary | ICD-10-CM | POA: Diagnosis present

## 2016-04-02 DIAGNOSIS — G9341 Metabolic encephalopathy: Secondary | ICD-10-CM | POA: Diagnosis present

## 2016-04-02 DIAGNOSIS — E875 Hyperkalemia: Secondary | ICD-10-CM | POA: Diagnosis present

## 2016-04-02 DIAGNOSIS — E1010 Type 1 diabetes mellitus with ketoacidosis without coma: Principal | ICD-10-CM | POA: Diagnosis present

## 2016-04-02 DIAGNOSIS — Z452 Encounter for adjustment and management of vascular access device: Secondary | ICD-10-CM

## 2016-04-02 DIAGNOSIS — Z833 Family history of diabetes mellitus: Secondary | ICD-10-CM

## 2016-04-02 DIAGNOSIS — E1110 Type 2 diabetes mellitus with ketoacidosis without coma: Secondary | ICD-10-CM | POA: Insufficient documentation

## 2016-04-02 LAB — CBC WITH DIFFERENTIAL/PLATELET
BASOS ABS: 0 10*3/uL (ref 0.0–0.1)
BASOS PCT: 0 %
Eosinophils Absolute: 0 10*3/uL (ref 0.0–0.7)
Eosinophils Relative: 0 %
HEMATOCRIT: 43.9 % (ref 39.0–52.0)
HEMOGLOBIN: 13.9 g/dL (ref 13.0–17.0)
LYMPHS PCT: 7 %
Lymphs Abs: 1.4 10*3/uL (ref 0.7–4.0)
MCH: 25.7 pg — ABNORMAL LOW (ref 26.0–34.0)
MCHC: 31.7 g/dL (ref 30.0–36.0)
MCV: 81.3 fL (ref 78.0–100.0)
Monocytes Absolute: 0.7 10*3/uL (ref 0.1–1.0)
Monocytes Relative: 4 %
NEUTROS ABS: 16.3 10*3/uL — AB (ref 1.7–7.7)
NEUTROS PCT: 89 %
Platelets: 307 10*3/uL (ref 150–400)
RBC: 5.4 MIL/uL (ref 4.22–5.81)
RDW: 15.9 % — AB (ref 11.5–15.5)
WBC: 18.4 10*3/uL — ABNORMAL HIGH (ref 4.0–10.5)

## 2016-04-02 LAB — URINE MICROSCOPIC-ADD ON
BACTERIA UA: NONE SEEN
RBC / HPF: NONE SEEN RBC/hpf (ref 0–5)
Squamous Epithelial / LPF: NONE SEEN
WBC UA: NONE SEEN WBC/hpf (ref 0–5)

## 2016-04-02 LAB — BASIC METABOLIC PANEL
ANION GAP: 13 (ref 5–15)
Anion gap: 24 — ABNORMAL HIGH (ref 5–15)
BUN: 79 mg/dL — ABNORMAL HIGH (ref 6–20)
BUN: 87 mg/dL — ABNORMAL HIGH (ref 6–20)
CALCIUM: 9.8 mg/dL (ref 8.9–10.3)
CHLORIDE: 115 mmol/L — AB (ref 101–111)
CO2: 8 mmol/L — AB (ref 22–32)
CO2: 15 mmol/L — AB (ref 22–32)
Calcium: 9.5 mg/dL (ref 8.9–10.3)
Chloride: 102 mmol/L (ref 101–111)
Creatinine, Ser: 2.82 mg/dL — ABNORMAL HIGH (ref 0.61–1.24)
Creatinine, Ser: 2.07 mg/dL — ABNORMAL HIGH (ref 0.61–1.24)
GFR calc non Af Amer: 25 mL/min — ABNORMAL LOW (ref >60)
GFR calc non Af Amer: 36 mL/min — ABNORMAL LOW (ref >60)
GFR, EST AFRICAN AMERICAN: 29 mL/min — AB (ref >60)
GFR, EST AFRICAN AMERICAN: 42 mL/min — AB (ref >60)
Glucose, Bld: 886 mg/dL (ref 65–99)
Glucose, Bld: 552 mg/dL (ref 65–99)
Potassium: 7.2 mmol/L (ref 3.5–5.1)
Potassium: 4.3 mmol/L (ref 3.5–5.1)
SODIUM: 143 mmol/L (ref 135–145)
Sodium: 134 mmol/L — ABNORMAL LOW (ref 135–145)

## 2016-04-02 LAB — CBG MONITORING, ED
Glucose-Capillary: >600 mg/dL (ref 65–99)
Glucose-Capillary: >600 mg/dL (ref 65–99)

## 2016-04-02 LAB — GLUCOSE, CAPILLARY
GLUCOSE-CAPILLARY: 418 mg/dL — AB (ref 65–99)
GLUCOSE-CAPILLARY: 553 mg/dL — AB (ref 65–99)
GLUCOSE-CAPILLARY: 558 mg/dL — AB (ref 65–99)
Glucose-Capillary: >600 mg/dL (ref 65–99)
Glucose-Capillary: >600 mg/dL (ref 65–99)
Glucose-Capillary: 469 mg/dL — ABNORMAL HIGH (ref 65–99)
Glucose-Capillary: 418 mg/dL — ABNORMAL HIGH (ref 65–99)

## 2016-04-02 LAB — URINALYSIS, ROUTINE W REFLEX MICROSCOPIC
Bilirubin Urine: NEGATIVE
Hgb urine dipstick: NEGATIVE
Ketones, ur: 40 mg/dL — AB
LEUKOCYTES UA: NEGATIVE
NITRITE: NEGATIVE
Protein, ur: NEGATIVE mg/dL
SPECIFIC GRAVITY, URINE: 1.027 (ref 1.005–1.030)
pH: 5.5 (ref 5.0–8.0)

## 2016-04-02 LAB — BLOOD GAS, VENOUS
Acid-base deficit: 20.7 mmol/L — ABNORMAL HIGH (ref 0.0–2.0)
BICARBONATE: 8.3 mmol/L — AB (ref 20.0–28.0)
O2 Saturation: 57.8 %
PH VEN: 7.1 — AB (ref 7.250–7.430)
Patient temperature: 98.6
pCO2, Ven: 28 mmHg — ABNORMAL LOW (ref 44.0–60.0)
pO2, Ven: 44.4 mmHg (ref 32.0–45.0)

## 2016-04-02 MED ORDER — ACETAMINOPHEN 325 MG PO TABS: 650.0000 mg | ORAL_TABLET | Freq: Four times a day (QID) | ORAL | Status: DC | PRN

## 2016-04-02 MED ORDER — INSULIN ASPART 100 UNIT/ML IV SOLN: 10.0000 [IU] | Freq: Once | INTRAVENOUS | Status: DC

## 2016-04-02 MED ORDER — SODIUM CHLORIDE 0.9 % IV SOLN
INTRAVENOUS | Status: AC
  Administered 2016-04-02: 18:00:00 via INTRAVENOUS

## 2016-04-02 MED ORDER — ONDANSETRON HCL 4 MG PO TABS: 4.0000 mg | ORAL_TABLET | Freq: Four times a day (QID) | ORAL | Status: DC | PRN

## 2016-04-02 MED ORDER — SODIUM CHLORIDE 0.9 % IV SOLN
1.0000 g | Freq: Once | INTRAVENOUS | Status: AC
  Administered 2016-04-02: 1 g via INTRAVENOUS
  Filled 2016-04-02: qty 10

## 2016-04-02 MED ORDER — ENOXAPARIN SODIUM 40 MG/0.4ML ~~LOC~~ SOLN
40.0000 mg | SUBCUTANEOUS | Status: DC
  Administered 2016-04-02 – 2016-04-04 (×3): 40 mg via SUBCUTANEOUS
  Filled 2016-04-02 (×3): qty 0.4

## 2016-04-02 MED ORDER — ONDANSETRON HCL 4 MG/2ML IJ SOLN
4.0000 mg | Freq: Once | INTRAMUSCULAR | Status: AC
  Administered 2016-04-02: 4 mg via INTRAVENOUS
  Filled 2016-04-02: qty 2

## 2016-04-02 MED ORDER — SODIUM CHLORIDE 0.9% FLUSH
3.0000 mL | Freq: Two times a day (BID) | INTRAVENOUS | Status: DC
  Administered 2016-04-02 – 2016-04-03 (×2): 3 mL via INTRAVENOUS

## 2016-04-02 MED ORDER — SODIUM CHLORIDE 0.9 % IV SOLN
INTRAVENOUS | Status: DC
  Administered 2016-04-02: 18:00:00 via INTRAVENOUS

## 2016-04-02 MED ORDER — INSULIN REGULAR HUMAN 100 UNIT/ML IJ SOLN: INTRAMUSCULAR | Status: DC

## 2016-04-02 MED ORDER — FAMOTIDINE IN NACL 20-0.9 MG/50ML-% IV SOLN
20.0000 mg | Freq: Every day | INTRAVENOUS | Status: DC
  Administered 2016-04-02 – 2016-04-03 (×2): 20 mg via INTRAVENOUS
  Filled 2016-04-02 (×2): qty 50

## 2016-04-02 MED ORDER — SODIUM CHLORIDE 0.9 % IV BOLUS (SEPSIS)
1000.0000 mL | Freq: Once | INTRAVENOUS | Status: AC
  Administered 2016-04-02: 1000 mL via INTRAVENOUS

## 2016-04-02 MED ORDER — ONDANSETRON HCL 4 MG/2ML IJ SOLN
4.0000 mg | Freq: Four times a day (QID) | INTRAMUSCULAR | Status: DC | PRN
  Administered 2016-04-04: 4 mg via INTRAVENOUS
  Filled 2016-04-02: qty 2

## 2016-04-02 MED ORDER — DEXTROSE-NACL 5-0.45 % IV SOLN
INTRAVENOUS | Status: DC
  Administered 2016-04-03: 03:00:00 via INTRAVENOUS

## 2016-04-02 MED ORDER — ACETAMINOPHEN 650 MG RE SUPP: 650.0000 mg | Freq: Four times a day (QID) | RECTAL | Status: DC | PRN

## 2016-04-02 MED ORDER — SODIUM CHLORIDE 0.9 % IV SOLN
INTRAVENOUS | Status: DC
  Administered 2016-04-02: 5.4 [IU]/h via INTRAVENOUS
  Filled 2016-04-02 (×3): qty 2.5

## 2016-04-02 MED ORDER — INSULIN ASPART 100 UNIT/ML IV SOLN
10.0000 [IU] | Freq: Once | INTRAVENOUS | Status: AC
  Administered 2016-04-02: 10 [IU] via INTRAVENOUS
  Filled 2016-04-02 (×2): qty 0.1

## 2016-04-02 NOTE — ED Triage Notes (Signed)
He c/o feeling "bad" with n/v x 2-3 days "not getting any better".  He is drowsy and in no distress.

## 2016-04-02 NOTE — ED Provider Notes (Signed)
[facility]-EMERGENCY DEPT Provider Note   CSN: [id number] Arrival date & time: 04/02/16  1327     History   Chief Complaint Chief Complaint  Patient presents with  . Emesis  . Hyperglycemia    HPI Seth Cruz is a 45 y.o. male presenting with concern for DKA. History of DKA 2 prior times. Started out with weakness/lethargy starting 2 days ago. Vomited twice today. Heavier breathing that is indicative of his prior DKA. No complaints of pain including no HA, CP, or abd pain. No dysuria but is urinating more. Has been feeling lightheaded when he sits up for 2 days.  HPI  Past Medical History:  Diagnosis Date  . Diabetes mellitus   . DKA (diabetic ketoacidoses) (HCC) 08/15/2014  . Hypertension   . Sinusitis 08/18/2014    Patient Active Problem List   Diagnosis Date Noted  . DKA (diabetic ketoacidoses) (HCC) 04/02/2016  . DM (diabetes mellitus) type 2, uncontrolled, with ketoacidosis (HCC) 03/06/2015  . Sepsis (HCC) 03/06/2015  . Sinusitis 08/18/2014  . Leukocytosis 08/17/2014  . Nausea and vomiting 08/17/2014  . Acute kidney injury (HCC) 08/15/2014  . SOB (shortness of breath) 08/15/2014  . Hyperkalemia 08/15/2014  . Pulmonary abscess (HCC) 07/16/2012  . Necrotizing pneumonia (HCC) 05/10/2012  . Diabetes mellitus (HCC) 08/12/2011  . Hypertension     Past Surgical History:  Procedure Laterality Date  . NO PAST SURGERIES         Home Medications    Prior to Admission medications   Medication Sig Start Date End Date Taking? Authorizing Provider  glucose blood test strip Use as instructed 03/06/15  Yes Nishant Dhungel, MD  Lancets (ACCU-CHEK MULTICLIX) lancets Use as instructed 03/06/15  Yes Nishant Dhungel, MD  LEVEMIR FLEXTOUCH 100 UNIT/ML Pen Inject 50 Units into the skin 2 (two) times daily. 04/01/16  Yes Historical Provider, MD  insulin NPH Human (HUMULIN N,NOVOLIN N) 100 UNIT/ML injection Inject 0.3 mLs (30 Units total) into the skin 2 (two) times daily before a  meal. Patient not taking: Reported on 04/02/2016 03/06/15   Nishant Dhungel, MD  Insulin Syringe-Needle U-100 (INSULIN SYRINGE 1CC/28G) 28G X 1/2" 1 ML MISC 1 application by Does not apply route 2 (two) times daily. Patient not taking: Reported on 04/02/2016 03/06/15   Nishant Dhungel, MD  lisinopril (PRINIVIL,ZESTRIL) 20 MG tablet Take 1 tablet (20 mg total) by mouth daily. Patient not taking: Reported on 04/02/2016 03/06/15   Eddie North, MD    Family History Family History  Problem Relation Age of Onset  . Diabetes Mellitus II Mother   . High blood pressure Mother     Social History Social History  Substance Use Topics  . Smoking status: Former Smoker    Packs/day: 0.25    Types: Cigarettes    Quit date: 05/05/2012  . Smokeless tobacco: Never Used  . Alcohol use 0.0 oz/week     Comment: none in six months     Allergies   Review of patient's allergies indicates no known allergies.   Review of Systems Review of Systems  Constitutional: Negative for fever.  Respiratory: Negative for shortness of breath.   Cardiovascular: Negative for chest pain.  Gastrointestinal: Positive for nausea and vomiting. Negative for abdominal pain.  Endocrine: Positive for polyuria.  Genitourinary: Negative for dysuria.  Neurological: Positive for weakness and light-headedness. Negative for headaches.  All other systems reviewed and are negative.    Physical Exam Updated Vital Signs BP (!) 88/68   Pulse 110   Temp  97.5 F (36.4 C) (Oral)   Resp 19   Ht 6' (1.829 m)   Wt 215 lb (97.5 kg)   SpO2 100%   BMI 29.16 kg/m   Physical Exam  Constitutional: He is oriented to person, place, and time. He appears well-developed and well-nourished. He appears lethargic.  Non-toxic appearance. No distress.  HENT:  Head: Normocephalic and atraumatic.  Right Ear: External ear normal.  Left Ear: External ear normal.  Nose: Nose normal.  Mouth/Throat: Mucous membranes are dry.  Eyes: Right eye  exhibits no discharge. Left eye exhibits no discharge.  Neck: Neck supple.  Cardiovascular: Regular rhythm.  Tachycardia present.   Murmur heard. Pulmonary/Chest: Effort normal and breath sounds normal.  Abdominal: Soft. There is no tenderness.  Musculoskeletal: He exhibits no edema.  Neurological: He is oriented to person, place, and time. He appears lethargic.  CN 3-12 grossly intact. 5/5 strength in all 4 extremities. Grossly normal sensation.  Skin: Skin is warm and dry. He is not diaphoretic.  Nursing note and vitals reviewed.    ED Treatments / Results  Labs (all labs ordered are listed, but only abnormal results are displayed) Labs Reviewed  BASIC METABOLIC PANEL - Abnormal; Notable for the following:       Result Value   Sodium 134 (*)    Potassium 7.2 (*)    CO2 8 (*)    Glucose, Bld 886 (*)    BUN 79 (*)    Creatinine, Ser 2.82 (*)    GFR calc non Af Amer 25 (*)    GFR calc Af Amer 29 (*)    Anion gap 24 (*)    All other components within normal limits  BLOOD GAS, VENOUS - Abnormal; Notable for the following:    pH, Ven 7.100 (*)    pCO2, Ven 28.0 (*)    Bicarbonate 8.3 (*)    Acid-base deficit 20.7 (*)    All other components within normal limits  CBC WITH DIFFERENTIAL/PLATELET - Abnormal; Notable for the following:    WBC 18.4 (*)    MCH 25.7 (*)    RDW 15.9 (*)    Neutro Abs 16.3 (*)    All other components within normal limits  CBG MONITORING, ED - Abnormal; Notable for the following:    Glucose-Capillary >600 (*)    All other components within normal limits  URINALYSIS, ROUTINE W REFLEX MICROSCOPIC (NOT AT [facility])  POTASSIUM  POTASSIUM  POTASSIUM  POTASSIUM    EKG  EKG Interpretation  Date/Time:  Saturday April 02 2016 15:17:27 EDT Ventricular Rate:  115 PR Interval:    QRS Duration: 103 QT Interval:  369 QTC Calculation: 511 R Axis:   99 Text Interpretation:  Sinus tachycardia Probable left atrial enlargement Borderline right axis  deviation Abnormal T, consider ischemia, inferior leads Prolonged QT interval Peaked T waves more prominent than Aug 2016 inferior T wave changes unchanged Confirmed by Criss AlvineGOLDSTON MD, Rondy Krupinski [phone]([id number]) on 04/02/2016 3:37:20 PM       Radiology No results found.  Procedures Procedures (including critical care time)  Medications Ordered in ED Medications  sodium chloride 0.9 % bolus 1,000 mL (1,000 mLs Intravenous New Bag/Given 04/02/16 1502)    And  sodium chloride 0.9 % bolus 1,000 mL (1,000 mLs Intravenous New Bag/Given 04/02/16 1505)    And  0.9 %  sodium chloride infusion (not administered)  calcium gluconate 1 g in sodium chloride 0.9 % 100 mL IVPB (1 g Intravenous New Bag/Given 04/02/16 1552)  insulin regular (NOVOLIN R,HUMULIN R) 250 Units in sodium chloride 0.9 % 250 mL (1 Units/mL) infusion (not administered)  insulin aspart (novoLOG) injection 10 Units (not administered)  ondansetron (ZOFRAN) injection 4 mg (4 mg Intravenous Given 04/02/16 1505)   CRITICAL CARE Performed by: Pricilla Loveless T   Total critical care time: 40 minutes  Critical care time was exclusive of separately billable procedures and treating other patients.  Critical care was necessary to treat or prevent imminent or life-threatening deterioration.  Critical care was time spent personally by me on the following activities: development of treatment plan with patient and/or surrogate as well as nursing, discussions with consultants, evaluation of patient's response to treatment, examination of patient, obtaining history from patient or surrogate, ordering and performing treatments and interventions, ordering and review of laboratory studies, ordering and review of radiographic studies, pulse oximetry and re-evaluation of patient's condition.   Angiocath insertion Performed by: Pricilla Loveless T  Consent: Verbal consent obtained. Risks and benefits: risks, benefits and alternatives were discussed Time out: Immediately  prior to procedure a "time out" was called to verify the correct patient, procedure, equipment, support staff and site/side marked as required.  Preparation: Patient was prepped and draped in the usual sterile fashion.  Vein Location: Left EJ  Gauge: 20  Normal blood return and flush without difficulty Patient tolerance: Patient tolerated the procedure well with no immediate complications.     Initial Impression / Assessment and Plan / ED Course  I have reviewed the triage vital signs and the nursing notes.  Pertinent labs & imaging results that were available during my care of the patient were reviewed by me and considered in my medical decision making (see chart for details).  Clinical Course  Comment By Time  Will start resuscitation with 2L IVF given he's clearly dehydrated and likely in DKA. Zofran, ECG, labs. No obvious infectious symptoms. Pricilla Loveless, MD 09/02 1409  ECG with peaked T waves. No BMP yet but will presumptively treat for hyperkalemia with calcium. Pricilla Loveless, MD 09/02 1524  D/w Dr. Ella Jubilee, to admit to stepdown unit Pricilla Loveless, MD 09/02 1559    Patient has improved with IV fluids. He will be given an IV insulin bolus given his severe hyperkalemia with EKG changes. Also placed on IV insulin drip. Continue fluid resuscitation, admit to the step down unit. No obvious inciting factor.  Final Clinical Impressions(s) / ED Diagnoses   Final diagnoses:  Diabetic ketoacidosis without coma associated with type 1 diabetes mellitus (HCC)  Hyperkalemia  Acute kidney injury [facility])    New Prescriptions New Prescriptions   No medications on file     Pricilla Loveless, MD 04/03/16 [phone]

## 2016-04-02 NOTE — ED Notes (Signed)
Gave phone report to Morrie SheldonAshley, Charity fundraiserN for pt. Phone report for pt Ok'd by ED charge RN and ICU Charge RN, Judeth CornfieldStephanie

## 2016-04-02 NOTE — Progress Notes (Signed)
[employer] Energy[employer] Line Placement.  Indication: IV access  Consent was obtained and a timeout was completed verifying correct patient, procedure, site and positioning. The patient was placed in appropriate dependent position for central line placement. The patient's right neck was prepped and draped in sterile fashion. 1% lidocaine was used to anesthetize the surrounding skin area. Ultrasound was used to identify the vein and observe tjhe needle entering the vein. A triple-lumen catheter was introduced into the internal jugular vein using Seldinger technique. The catheter was threaded smoothly over the guidewire and guidewire was removed. Appropriate blood return was obtained and each lumen of the catheter was evacuated of air and flushed with sterile saline. The catheter was then sutured in place to the skin and a sterile dressing applied. The patient tolerated the procedure well and there were no complications. Blood loss was minimal.  X-ray was ordered to assess for pneumothorax and catheter placement.

## 2016-04-02 NOTE — ED Notes (Signed)
Pharmacy sent the incorrect dose for the IV insulin 10 units. Called pharmacy to make them aware. They are resending new dose

## 2016-04-02 NOTE — ED Notes (Signed)
I have just relayed K+ of 7.2 and Glucose of 886 to PrincetonSusan, Charity fundraiserN.

## 2016-04-02 NOTE — H&P (Signed)
History and Physical    Seth Cruz ZOX:[id number] DOB: [birth year] DOA: 04/02/2016  PCP: [name], MD   Patient coming from: Home  Chief Complaint: Generalized weakness  HPI: Seth Cruz is a 45 y.o. male with medical history significant of diabetes mellitus type 1, who presents to the hospital with the chief complaint of generalized weakness and generalized malaise for last 48 hours. He ran out of his insulin about 48 hours ago, he was able to get his insulin again yesterday, he spent about one day without insulin. He has been feeling generalized weakness which has been moderate to severe, no improving or worsening factors, persistent for last 48 hours, associated with abdominal pain, polyuria and polydipsia. His serum glucose has been rising up to 400 despite using insulin again for last 24 hours. He usually takes Levemir 30 units twice daily and nasal sinus scale, his serum glucose usually runs about 200 home.  ED Course: IV fluids and insulin infusion.  Review of Systems:  Patient is somnolent and able to get full history 1. General positive for generalized malaise and generalized weakness, no waking of weight loss. 2. Pulmonary no shortness of breath cough or hemoptysis 3. Cardiovascular no angina, claudication, PND or orthopnea 4. Muscle skeletal no joint pain 5. Gastrointestinal positive for nausea and vomiting but no diarrhea, positive abdominal pain 6. Dermatology no rashes 7. Urology no dysuria but positive polyuria 8. Hematology no easy bruisability or frequent infections 9. Neurology no seizures or paresthesias 10. Psych no depression or anxiety  Past Medical History:  Diagnosis Date  . Diabetes mellitus   . DKA (diabetic ketoacidoses) (HCC) 08/15/2014  . Hypertension   . Sinusitis 08/18/2014    Past Surgical History:  Procedure Laterality Date  . NO PAST SURGERIES       reports that he quit smoking about 3 years ago. His smoking use included Cigarettes. He  smoked 0.25 packs per day. He has never used smokeless tobacco. He reports that he drinks alcohol. He reports that he uses drugs.  No Known Allergies  Family History  Problem Relation Age of Onset  . Diabetes Mellitus II Mother   . High blood pressure Mother      Prior to Admission medications   Medication Sig Start Date End Date Taking? Authorizing Provider  glucose blood test strip Use as instructed 03/06/15  Yes Nishant Dhungel, MD  Lancets (ACCU-CHEK MULTICLIX) lancets Use as instructed 03/06/15  Yes Nishant Dhungel, MD  LEVEMIR FLEXTOUCH 100 UNIT/ML Pen Inject 50 Units into the skin 2 (two) times daily. 04/01/16  Yes Historical Provider, MD  insulin NPH Human (HUMULIN N,NOVOLIN N) 100 UNIT/ML injection Inject 0.3 mLs (30 Units total) into the skin 2 (two) times daily before a meal. Patient not taking: Reported on 04/02/2016 03/06/15   Nishant Dhungel, MD  Insulin Syringe-Needle U-100 (INSULIN SYRINGE 1CC/28G) 28G X 1/2" 1 ML MISC 1 application by Does not apply route 2 (two) times daily. Patient not taking: Reported on 04/02/2016 03/06/15   Nishant Dhungel, MD  lisinopril (PRINIVIL,ZESTRIL) 20 MG tablet Take 1 tablet (20 mg total) by mouth daily. Patient not taking: Reported on 04/02/2016 03/06/15   Eddie North, MD    Physical Exam: Vitals:   04/02/16 1615 04/02/16 1630 04/02/16 1632 04/02/16 1645  BP:  108/83 108/83   Pulse: 109 113 112 119  Resp: 17 14 15 20   Temp:      TempSrc:      SpO2: 99% 99% 98% 100%  Weight:  Height:          Constitutional: Deconditioned, ill-looking appearing, somnolent Vitals:   04/02/16 1615 04/02/16 1630 04/02/16 1632 04/02/16 1645  BP:  108/83 108/83   Pulse: 109 113 112 119  Resp: 17 14 15 20   Temp:      TempSrc:      SpO2: 99% 99% 98% 100%  Weight:      Height:       Eyes: PERRL, lids and conjunctivae Pale but no icterus ENMT: Mucous membranes are dry. Posterior pharynx clear of any exudate or lesions.Normal dentition.  Neck: normal,  supple, no masses, no thyromegaly Respiratory: clear to auscultation bilaterally, no wheezing, no crackles. Normal respiratory effort. No accessory muscle use. Decreased inspiratory effort.  Cardiovascular: Regular rate and rhythm, no murmurs / rubs / gallops. No extremity edema. 2+ pedal pulses. No carotid bruits. Tachycardic Abdomen: Tender to deep palpation, no masses palpated. No hepatosplenomegaly. Bowel sounds positive.  Musculoskeletal: no clubbing / cyanosis. No joint deformity upper and lower extremities. Good ROM, no contractures. Normal muscle tone.  Skin: no rashes, lesions, ulcers. No induration Neurologic: CN 2-12 grossly intact. Sensation intact, DTR normal. Strength 5/5 in all 4. Somnolent but easy to arouse. .   Labs on Admission: I have personally reviewed following labs and imaging studies  CBC:  Recent Labs Lab 04/02/16 1454  WBC 18.4*  NEUTROABS 16.3*  HGB 13.9  HCT 43.9  MCV 81.3  PLT 307   Basic Metabolic Panel:  Recent Labs Lab 04/02/16 1454  NA 134*  K 7.2*  CL 102  CO2 8*  GLUCOSE 886*  BUN 79*  CREATININE 2.82*  CALCIUM 9.8   GFR: Estimated Creatinine Clearance: 39.2 mL/min (by C-G formula based on SCr of 2.82 mg/dL). Liver Function Tests: No results for input(s): AST, ALT, ALKPHOS, BILITOT, PROT, ALBUMIN in the last 168 hours. No results for input(s): LIPASE, AMYLASE in the last 168 hours. No results for input(s): AMMONIA in the last 168 hours. Coagulation Profile: No results for input(s): INR, PROTIME in the last 168 hours. Cardiac Enzymes: No results for input(s): CKTOTAL, CKMB, CKMBINDEX, TROPONINI in the last 168 hours. BNP (last 3 results) No results for input(s): PROBNP in the last 8760 hours. HbA1C: No results for input(s): HGBA1C in the last 72 hours. CBG:  Recent Labs Lab 04/02/16 1342 04/02/16 1627 04/02/16 1724  GLUCAP >600* >600* >600*   Lipid Profile: No results for input(s): CHOL, HDL, LDLCALC, TRIG, CHOLHDL,  LDLDIRECT in the last 72 hours. Thyroid Function Tests: No results for input(s): TSH, T4TOTAL, FREET4, T3FREE, THYROIDAB in the last 72 hours. Anemia Panel: No results for input(s): VITAMINB12, FOLATE, FERRITIN, TIBC, IRON, RETICCTPCT in the last 72 hours. Urine analysis:    Component Value Date/Time   COLORURINE YELLOW 03/04/2015 1504   APPEARANCEUR CLEAR 03/04/2015 1504   LABSPEC 1.027 03/04/2015 1504   PHURINE 5.0 03/04/2015 1504   GLUCOSEU >1000 (A) 03/04/2015 1504   HGBUR TRACE (A) 03/04/2015 1504   BILIRUBINUR NEGATIVE 03/04/2015 1504   KETONESUR >80 (A) 03/04/2015 1504   PROTEINUR NEGATIVE 03/04/2015 1504   UROBILINOGEN 0.2 03/04/2015 1504   NITRITE NEGATIVE 03/04/2015 1504   LEUKOCYTESUR NEGATIVE 03/04/2015 1504   Sepsis Labs: !!!!!!!!!!!!!!!!!!!!!!!!!!!!!!!!!!!!!!!!!!!! @LABRCNTIP (procalcitonin:4,lacticidven:4) )No results found for this or any previous visit (from the past 240 hour(s)).   Radiological Exams on Admission: No results found.  EKG: Independently reviewed.  sinus tachycardia, inferior T-wave inversions, with  peak T waves in the precordial leads.  Assessment/Plan Active Problems:  DKA (diabetic ketoacidoses) (HCC)   DKA, type 1 (HCC)    This is a 45 year old gentleman with history of diabetes mellitus, insulin-dependent. Patient for last 48 hours has been developing worsening generalized malaise with polydipsia, polyuria and persistent hyperglycemia. He ran out of his insulin and he has spent about one day without insulin. He has been able to refill his medication yesterday. Despite using insulin for last 24 hours his symptoms have been deteriorating. On his initial presentation his blood pressure was 81/56, heart rate of 117, respiratory rate 16, oxygen saturation 98% on room air, temperature is 97.5. With IV fluids his blood pressure has increased to 108/83. He is persistent tachycardic up to 119. His oral mucosa is dry, he has decreased breath sounds at  bases, his abdomen is tender to deep palpation. Sodium is 134, potassium 7.2, glucose is 886, creatinine 2.8, BUN 79, chloride 102, and anion gap 24, venous pH 7.10, white count is 18.4, hemoglobin 13.9, hematocrit 43.9, platelet count 307. His EKG has peak T waves on the precordial leads.  Working diagnosis. Diabetes ketoacidosis complicated by hyperkalemia and acute kidney injury.  1. Diabetes ketoacidosis. Will continue patient on IV fluids with normal saline at 125 mL per hour, will continue insulin infusion intravenously per protocol, Accu-Cheks every hour. Will keep the patient nothing by mouth. Once the glucose less than 250 will change fluids to D5 half-normal saline at 100 mL per hour. Once anion  gap is closed and patient is tolerating by mouth diet will bridge to subcutaneous insulin.  2. Acute kidney injury. Prerenal due to volume loss related to osmotic diuresis related to hyperglycemia. Will check renal panel every 4 hours. Monitor urine output.   3. Hyperkalemia. Due to acute kidney injury and potassium movement to the extracellular space. Patient will continue supportive care with insulin therapy and IV fluids, positive EKG changes, will need to be followed up with continuous telemetry monitoring in the stepdown unit.  4. Metabolic encephalopathy. Due to hyperglycemia and ketosis . Continue supportive care. 10 units neuro checks per unit protocol.  Patient is at high risk of developing worsening ketoacidosis  DVT prophylaxis: lovenox Code Status: full Family Communication: No family at the bedside Disposition Plan: home Consults called: no Admission status: Inpatient to stepdown unit.   Seth Annett Gulaaniel Arrien MD [facility] Pager [phone]  If 7PM-7AM, please contact night-coverage [email or web address] Password [email or web address]  04/02/2016, 5:27 PM

## 2016-04-03 ENCOUNTER — Encounter (HOSPITAL_COMMUNITY): Payer: Self-pay

## 2016-04-03 DIAGNOSIS — E1010 Type 1 diabetes mellitus with ketoacidosis without coma: Principal | ICD-10-CM

## 2016-04-03 DIAGNOSIS — E870 Hyperosmolality and hypernatremia: Secondary | ICD-10-CM | POA: Diagnosis not present

## 2016-04-03 LAB — CBC
HEMATOCRIT: 37.1 % — AB (ref 39.0–52.0)
HEMOGLOBIN: 12.5 g/dL — AB (ref 13.0–17.0)
MCH: 25.4 pg — ABNORMAL LOW (ref 26.0–34.0)
MCHC: 33.7 g/dL (ref 30.0–36.0)
MCV: 75.4 fL — AB (ref 78.0–100.0)
Platelets: 282 10*3/uL (ref 150–400)
RBC: 4.92 MIL/uL (ref 4.22–5.81)
RDW: 15.1 % (ref 11.5–15.5)
WBC: 14 10*3/uL — ABNORMAL HIGH (ref 4.0–10.5)

## 2016-04-03 LAB — BASIC METABOLIC PANEL
ANION GAP: 5 (ref 5–15)
ANION GAP: 6 (ref 5–15)
Anion gap: 5 (ref 5–15)
Anion gap: 3 — ABNORMAL LOW (ref 5–15)
Anion gap: 4 — ABNORMAL LOW (ref 5–15)
BUN: 83 mg/dL — ABNORMAL HIGH (ref 6–20)
BUN: 78 mg/dL — ABNORMAL HIGH (ref 6–20)
BUN: 74 mg/dL — AB (ref 6–20)
BUN: 66 mg/dL — AB (ref 6–20)
BUN: 64 mg/dL — ABNORMAL HIGH (ref 6–20)
CALCIUM: 9.8 mg/dL (ref 8.9–10.3)
CALCIUM: 9.9 mg/dL (ref 8.9–10.3)
CALCIUM: 9.9 mg/dL (ref 8.9–10.3)
CALCIUM: 9.6 mg/dL (ref 8.9–10.3)
CHLORIDE: 122 mmol/L — AB (ref 101–111)
CHLORIDE: 124 mmol/L — AB (ref 101–111)
CHLORIDE: 123 mmol/L — AB (ref 101–111)
CHLORIDE: 124 mmol/L — AB (ref 101–111)
CHLORIDE: 121 mmol/L — AB (ref 101–111)
CO2: 20 mmol/L — AB (ref 22–32)
CO2: 20 mmol/L — AB (ref 22–32)
CO2: 21 mmol/L — AB (ref 22–32)
CO2: 19 mmol/L — AB (ref 22–32)
CO2: 20 mmol/L — AB (ref 22–32)
CREATININE: 1.57 mg/dL — AB (ref 0.61–1.24)
CREATININE: 1.39 mg/dL — AB (ref 0.61–1.24)
CREATININE: 1.39 mg/dL — AB (ref 0.61–1.24)
CREATININE: 1.24 mg/dL (ref 0.61–1.24)
CREATININE: 1.27 mg/dL — AB (ref 0.61–1.24)
Calcium: 9.6 mg/dL (ref 8.9–10.3)
GFR calc Af Amer: >60 mL/min (ref >60)
GFR calc non Af Amer: 51 mL/min — ABNORMAL LOW (ref >60)
GFR calc non Af Amer: 59 mL/min — ABNORMAL LOW (ref >60)
GFR calc non Af Amer: 59 mL/min — ABNORMAL LOW (ref >60)
GFR calc non Af Amer: >60 mL/min (ref >60)
GFR calc non Af Amer: >60 mL/min (ref >60)
GFR, EST AFRICAN AMERICAN: 59 mL/min — AB (ref >60)
GLUCOSE: 140 mg/dL — AB (ref 65–99)
GLUCOSE: 175 mg/dL — AB (ref 65–99)
GLUCOSE: 205 mg/dL — AB (ref 65–99)
Glucose, Bld: 268 mg/dL — ABNORMAL HIGH (ref 65–99)
Glucose, Bld: 142 mg/dL — ABNORMAL HIGH (ref 65–99)
Potassium: 3.9 mmol/L (ref 3.5–5.1)
Potassium: 4.1 mmol/L (ref 3.5–5.1)
Potassium: 4.3 mmol/L (ref 3.5–5.1)
Potassium: 4.4 mmol/L (ref 3.5–5.1)
Potassium: 4.7 mmol/L (ref 3.5–5.1)
SODIUM: 147 mmol/L — AB (ref 135–145)
SODIUM: 150 mmol/L — AB (ref 135–145)
Sodium: 149 mmol/L — ABNORMAL HIGH (ref 135–145)
Sodium: 146 mmol/L — ABNORMAL HIGH (ref 135–145)
Sodium: 145 mmol/L (ref 135–145)

## 2016-04-03 LAB — MRSA PCR SCREENING: MRSA by PCR: NEGATIVE

## 2016-04-03 LAB — GLUCOSE, CAPILLARY
GLUCOSE-CAPILLARY: 139 mg/dL — AB (ref 65–99)
GLUCOSE-CAPILLARY: 148 mg/dL — AB (ref 65–99)
GLUCOSE-CAPILLARY: 158 mg/dL — AB (ref 65–99)
GLUCOSE-CAPILLARY: 163 mg/dL — AB (ref 65–99)
GLUCOSE-CAPILLARY: 205 mg/dL — AB (ref 65–99)
GLUCOSE-CAPILLARY: 323 mg/dL — AB (ref 65–99)
Glucose-Capillary: 283 mg/dL — ABNORMAL HIGH (ref 65–99)
Glucose-Capillary: 139 mg/dL — ABNORMAL HIGH (ref 65–99)
Glucose-Capillary: 129 mg/dL — ABNORMAL HIGH (ref 65–99)
Glucose-Capillary: 114 mg/dL — ABNORMAL HIGH (ref 65–99)
Glucose-Capillary: 176 mg/dL — ABNORMAL HIGH (ref 65–99)
Glucose-Capillary: 166 mg/dL — ABNORMAL HIGH (ref 65–99)

## 2016-04-03 MED ORDER — SODIUM CHLORIDE 0.9% FLUSH
10.0000 mL | Freq: Two times a day (BID) | INTRAVENOUS | Status: DC
  Administered 2016-04-03: 10 mL

## 2016-04-03 MED ORDER — FAMOTIDINE 20 MG PO TABS
20.0000 mg | ORAL_TABLET | Freq: Every day | ORAL | Status: DC
  Administered 2016-04-04: 20 mg via ORAL
  Filled 2016-04-03: qty 1

## 2016-04-03 MED ORDER — FAMOTIDINE 20 MG PO TABS: 20.0000 mg | ORAL_TABLET | Freq: Every day | ORAL | Status: DC

## 2016-04-03 MED ORDER — INSULIN ASPART 100 UNIT/ML ~~LOC~~ SOLN
0.0000 [IU] | Freq: Three times a day (TID) | SUBCUTANEOUS | Status: DC
  Administered 2016-04-03 (×2): 3 [IU] via SUBCUTANEOUS

## 2016-04-03 MED ORDER — SODIUM CHLORIDE 0.45 % IV SOLN
INTRAVENOUS | Status: DC
  Administered 2016-04-03 – 2016-04-05 (×3): via INTRAVENOUS

## 2016-04-03 MED ORDER — SODIUM CHLORIDE 0.9 % IV BOLUS (SEPSIS)
1000.0000 mL | Freq: Once | INTRAVENOUS | Status: AC
  Administered 2016-04-03: 1000 mL via INTRAVENOUS

## 2016-04-03 MED ORDER — INSULIN ASPART 100 UNIT/ML ~~LOC~~ SOLN: 0.0000 [IU] | Freq: Every day | SUBCUTANEOUS | Status: DC

## 2016-04-03 MED ORDER — INSULIN DETEMIR 100 UNIT/ML ~~LOC~~ SOLN
50.0000 [IU] | Freq: Two times a day (BID) | SUBCUTANEOUS | Status: DC
  Administered 2016-04-03 – 2016-04-04 (×3): 50 [IU] via SUBCUTANEOUS
  Filled 2016-04-03 (×4): qty 0.5

## 2016-04-03 MED ORDER — SODIUM CHLORIDE 0.9% FLUSH
10.0000 mL | INTRAVENOUS | Status: DC | PRN
  Administered 2016-04-03: 10 mL
  Administered 2016-04-04 (×3): 20 mL
  Administered 2016-04-04: 10 mL
  Filled 2016-04-03 (×5): qty 40

## 2016-04-03 NOTE — Progress Notes (Signed)
[facility] Progress Note  Patient: Seth Cruz ZOX:[id number]   PCP: [name], MD DOB: 05-15-1971   DOA: 04/02/2016   DOS: 04/03/2016   Date of Service: the patient was seen and examined on 04/03/2016  Subjective: Patient is complains about tiredness denies any nausea or vomiting no fever no chills. He did not have money and therefore an out of physician. Nutrition: Minimal oral intake  Brief hospital course: Pt. with PMH of type 1 diabetes mellitus, HTN; admitted on 04/02/2016, with complaint of nausea and vomiting, was found to have DKA. Currently further plan is continue monitoring her blood sugars.  Assessment and Plan: 1. DKA, type 1 (HCC) Anion gap is currently closed. BMP appears to be relatively stable. Continue IV fluids will be changed to half normal saline due to hypernatremia.  2. Hypernatremia. Likely due to IV fluids. Currently on half normal saline. Continue to monitor BMP every 4 hours. Expected to improve within next 12 hours and can discontinue IV fluids after that.  3. Acute kidney injury. Renal function improving. Continue to monitor.  Pain management: When necessary Tylenol Activity: No indication for physical therapy Bowel regimen: last BM 04/01/2016 Diet: Carb modified diet DVT Prophylaxis: subcutaneous Heparin  Advance goals of care discussion: Full code  Family Communication: no family was present at bedside, at the time of interview.  Disposition:  Discharge to home. Expected discharge date: 04/04/2016, improvement in sodium and glucose levels  Consultants: None Procedures: None  Antibiotics: Anti-infectives    None        Intake/Output Summary (Last 24 hours) at 04/03/16 1514 Last data filed at 04/03/16 1300  Gross per 24 hour  Intake           2138.8 ml  Output             2050 ml  Net             88.8 ml   Filed Weights   04/02/16 1343  Weight: 97.5 kg (215 lb)    Objective: Physical Exam: Vitals:   04/03/16 1000  04/03/16 1100 04/03/16 1200 04/03/16 1331  BP: 92/60  (!) 113/95 129/90  Pulse: (!) 109 (!) 104 (!) 109 100  Resp: 14 14 11 16   Temp:   98.4 F (36.9 C) 98.1 F (36.7 C)  TempSrc:   Oral Oral  SpO2: 100% 100% 98% 100%  Weight:      Height:        General: Alert, Awake and Oriented to Time, Place and Person. Appear in mild distress Eyes: PERRL, Conjunctiva normal ENT: Oral Mucosa clear moist. Neck: no JVD, no Abnormal Mass Or lumps Cardiovascular: S1 and S2 Present, no Murmur, Respiratory: Bilateral Air entry equal and Decreased, Clear to Auscultation, no Crackles, no wheezes Abdomen: Bowel Sound present, Soft and no tenderness Skin: no redness, no Rash  Extremities: no Pedal edema, no calf tenderness Neurologic: Grossly no focal neuro deficit. Bilaterally Equal motor strength  Data Reviewed: CBC:  Recent Labs Lab 04/02/16 1454 04/03/16 0440  WBC 18.4* 14.0*  NEUTROABS 16.3*  --   HGB 13.9 12.5*  HCT 43.9 37.1*  MCV 81.3 75.4*  PLT 307 282   Basic Metabolic Panel:  Recent Labs Lab 04/02/16 2103 04/03/16 0130 04/03/16 0440 04/03/16 0725 04/03/16 1138  NA 143 147* 150* 149* 146*  K 4.3 3.9 4.1 4.3 4.4  CL 115* 122* 124* 123* 124*  CO2 15* 20* 20* 21* 19*  GLUCOSE 552* 268* 142* 140* 175*  BUN  87* 83* 78* 74* 66*  CREATININE 2.07* 1.57* 1.39* 1.39* 1.24  CALCIUM 9.5 9.8 9.9 9.9 9.6    Liver Function Tests: No results for input(s): AST, ALT, ALKPHOS, BILITOT, PROT, ALBUMIN in the last 168 hours. No results for input(s): LIPASE, AMYLASE in the last 168 hours. No results for input(s): AMMONIA in the last 168 hours. Coagulation Profile: No results for input(s): INR, PROTIME in the last 168 hours. Cardiac Enzymes: No results for input(s): CKTOTAL, CKMB, CKMBINDEX, TROPONINI in the last 168 hours. BNP (last 3 results) No results for input(s): PROBNP in the last 8760 hours.  CBG:  Recent Labs Lab 04/03/16 0545 04/03/16 0646 04/03/16 0857 04/03/16 1006  04/03/16 1204  GLUCAP 139* 139* 129* 114* 163*    Studies: Dg Chest 1 View  Result Date: 04/02/2016 CLINICAL DATA:  Central line placement, diabetes mellitus, hypertension, former smoker EXAM: CHEST 1 VIEW COMPARISON:  Portable exam 1925 hours compared to 03/04/2015 FINDINGS: RIGHT jugular central venous catheter with tip projecting over SVC. Normal heart size, mediastinal contours, and pulmonary vascularity. Lungs clear. No pleural effusion or pneumothorax. IMPRESSION: No pneumothorax following RIGHT jugular line placement. Electronically Signed   By: Ulyses SouthwardMark  Boles M.D.   On: 04/02/2016 20:07     Scheduled Meds: . enoxaparin (LOVENOX) injection  40 mg Subcutaneous Q24H  . [START ON 04/04/2016] famotidine  20 mg Oral Daily  . insulin aspart  0-15 Units Subcutaneous TID WC  . insulin aspart  0-5 Units Subcutaneous QHS  . insulin detemir  50 Units Subcutaneous BID  . sodium chloride flush  10-40 mL Intracatheter Q12H  . sodium chloride flush  3 mL Intravenous Q12H   Continuous Infusions: . sodium chloride 75 mL/hr at 04/03/16 1200   PRN Meds: acetaminophen **OR** acetaminophen, ondansetron **OR** ondansetron (ZOFRAN) IV, sodium chloride flush  Time spent: 30 minutes  Author: Lynden OxfordPranav Rhythm Gubbels, MD [facility] Pager: [phone] 04/03/2016 3:14 PM  If 7PM-7AM, please contact night-coverage at [email or web address], password [email or web address]

## 2016-04-04 LAB — CBC
HCT: 31.6 % — ABNORMAL LOW (ref 39.0–52.0)
HEMOGLOBIN: 10.5 g/dL — AB (ref 13.0–17.0)
MCH: 25.4 pg — AB (ref 26.0–34.0)
MCHC: 33.2 g/dL (ref 30.0–36.0)
MCV: 76.5 fL — AB (ref 78.0–100.0)
Platelets: 204 10*3/uL (ref 150–400)
RBC: 4.13 MIL/uL — AB (ref 4.22–5.81)
RDW: 15.7 % — ABNORMAL HIGH (ref 11.5–15.5)
WBC: 12.2 10*3/uL — ABNORMAL HIGH (ref 4.0–10.5)

## 2016-04-04 LAB — GLUCOSE, CAPILLARY
GLUCOSE-CAPILLARY: 101 mg/dL — AB (ref 65–99)
GLUCOSE-CAPILLARY: 90 mg/dL (ref 65–99)
Glucose-Capillary: 128 mg/dL — ABNORMAL HIGH (ref 65–99)
Glucose-Capillary: 95 mg/dL (ref 65–99)
Glucose-Capillary: 85 mg/dL (ref 65–99)
Glucose-Capillary: 55 mg/dL — ABNORMAL LOW (ref 65–99)
Glucose-Capillary: 66 mg/dL (ref 65–99)

## 2016-04-04 LAB — TSH: TSH: 0.631 u[IU]/mL (ref 0.350–4.500)

## 2016-04-04 LAB — CORTISOL: Cortisol, Plasma: 20.8 ug/dL

## 2016-04-04 LAB — BASIC METABOLIC PANEL
ANION GAP: 3 — AB (ref 5–15)
ANION GAP: 3 — AB (ref 5–15)
BUN: 51 mg/dL — AB (ref 6–20)
BUN: 41 mg/dL — ABNORMAL HIGH (ref 6–20)
CALCIUM: 9.2 mg/dL (ref 8.9–10.3)
CHLORIDE: 122 mmol/L — AB (ref 101–111)
CHLORIDE: 120 mmol/L — AB (ref 101–111)
CO2: 21 mmol/L — ABNORMAL LOW (ref 22–32)
CO2: 23 mmol/L (ref 22–32)
CREATININE: 1.24 mg/dL (ref 0.61–1.24)
Calcium: 9.3 mg/dL (ref 8.9–10.3)
Creatinine, Ser: 1.18 mg/dL (ref 0.61–1.24)
GFR calc non Af Amer: >60 mL/min (ref >60)
GLUCOSE: 170 mg/dL — AB (ref 65–99)
Glucose, Bld: 114 mg/dL — ABNORMAL HIGH (ref 65–99)
Potassium: 4.3 mmol/L (ref 3.5–5.1)
Potassium: 4.2 mmol/L (ref 3.5–5.1)
Sodium: 146 mmol/L — ABNORMAL HIGH (ref 135–145)
Sodium: 146 mmol/L — ABNORMAL HIGH (ref 135–145)

## 2016-04-04 LAB — T4, FREE: FREE T4: 1.01 ng/dL (ref 0.61–1.12)

## 2016-04-04 LAB — MAGNESIUM: Magnesium: 2.2 mg/dL (ref 1.7–2.4)

## 2016-04-04 MED ORDER — GLUCOSE 40 % PO GEL: 1.0000 | Freq: Once | ORAL | Status: DC

## 2016-04-04 MED ORDER — SODIUM CHLORIDE 0.9 % IV BOLUS (SEPSIS)
1000.0000 mL | INTRAVENOUS | Status: AC
  Administered 2016-04-04 (×2): 1000 mL via INTRAVENOUS

## 2016-04-04 MED ORDER — PANTOPRAZOLE SODIUM 40 MG PO TBEC
40.0000 mg | DELAYED_RELEASE_TABLET | Freq: Every day | ORAL | Status: DC
  Administered 2016-04-04 – 2016-04-05 (×2): 40 mg via ORAL
  Filled 2016-04-04 (×2): qty 1

## 2016-04-04 MED ORDER — INSULIN DETEMIR 100 UNIT/ML ~~LOC~~ SOLN
40.0000 [IU] | Freq: Two times a day (BID) | SUBCUTANEOUS | Status: DC
  Administered 2016-04-04: 40 [IU] via SUBCUTANEOUS
  Filled 2016-04-04 (×2): qty 0.4

## 2016-04-04 MED ORDER — SODIUM CHLORIDE 0.9 % IV BOLUS (SEPSIS)
1000.0000 mL | Freq: Once | INTRAVENOUS | Status: AC
  Administered 2016-04-04: 1000 mL via INTRAVENOUS

## 2016-04-04 MED ORDER — GLUCOSE 40 % PO GEL
ORAL | Status: AC
  Administered 2016-04-04: 17:00:00
  Filled 2016-04-04: qty 1

## 2016-04-04 NOTE — Progress Notes (Signed)
Repeat ortho VS completed with improvement. MD paged to be made aware. Pt continue to c/o dizziness and weakness upon standing, but was able to stand for 2 mins this time before requesting to sit down.

## 2016-04-04 NOTE — Care Management Note (Signed)
Case Management Note  Patient Details  Name: Arlana PouchOrlando Osmundson MRN: [id number] Date of Birth: [birth year]  Subjective/Objective:  45 y/o m admitted w/DKA. From home. Has health insurance, & script coverage. Informed patient of his responsibility to pay his co pay, & to get his scripts filled timely so he doesn't run out.Has pcp, pharmacy. Patient voiced understanding. No further CM needs.                  Action/Plan:d/c plan home.   Expected Discharge Date:                  Expected Discharge Plan:  Home/Self Care  In-House Referral:     Discharge planning Services  CM Consult, Medication Assistance  Post Acute Care Choice:    Choice offered to:     DME Arranged:    DME Agency:     HH Arranged:    HH Agency:     Status of Service:  In process, will continue to follow  If discussed at Long Length of Stay Meetings, dates discussed:    Additional Comments:  Lanier ClamMahabir, Aquilla Shambley, RN 04/04/2016, 12:40 PM

## 2016-04-04 NOTE — Progress Notes (Signed)
Hypoglycemic Event  CBG: 66 @ 1636  Treatment: 15 GM carbohydrate snack  Symptoms: None  Follow-up CBG: Time: 1714 CBG Result: 55  Possible Reasons for Event: Inadequate meal intake  Comments/MD notified: Standing order Glucose gel was administered at 1724 and CBG was again rechecked at 1744- CBG 101 upon recheck. MD decreased BID Levemir dosage.    Robyne PeersKatelyn Walters Roxy Filler

## 2016-04-04 NOTE — Progress Notes (Signed)
Orthostatic VS performed as ordered- patient tolerated sitting at side of bed well, but immediately c/o "dizziness" and "harder to breathe" upon standing. BP decreased severely from sitting to standing. Pt was immediately assisted in lying back down in bed and dizziness resolved. O2 Sats remained 100% on RA. Pt c/o feeling "nervous when you even mention having to stand up." BP returned to normal range upon lying back down in bed.

## 2016-04-04 NOTE — Progress Notes (Signed)
Inpatient Diabetes Program Recommendations  AACE/ADA: New Consensus Statement on Inpatient Glycemic Control (2015)  Target Ranges:  Prepandial:   less than 140 mg/dL      Peak postprandial:   less than 180 mg/dL (1-2 hours)      Critically ill patients:  140 - 180 mg/dL   Lab Results  Component Value Date   GLUCAP 66 04/04/2016   HGBA1C 13.5 (H) 03/04/2015    Review of Glycemic Control Results for Seth Cruz, Pedro (MRN [id number]) as of 04/04/2016 16:45  Ref. Range 04/03/2016 10:06 04/03/2016 12:04 04/03/2016 16:32 04/03/2016 22:36 04/04/2016 07:41 04/04/2016 12:15 04/04/2016 16:36  Glucose-Capillary Latest Ref Range: 65 - 99 mg/dL 409114 (H) 811163 (H) 914176 (H) 166 (H) 95 85 66    Inpatient Diabetes Program Recommendations:    Need updated HgbA1C - Last one 03/04/2015 - 13.5%. Decrease Levemir to 45 units bid to prevent hypoglycemia.  Continue to follow. Thank you. Ailene Ardshonda Emelynn Rance, RD, LDN, CDE Inpatient Diabetes Coordinator [phone]

## 2016-04-04 NOTE — Progress Notes (Signed)
[facility] Progress Note  Patient: Seth PouchOrlando Kaczorowski JXB:[id number]   PCP: Gwynneth AlimentSANDERS,ROBYN N, MD DOB: [birth year]   DOA: 04/02/2016   DOS: 04/04/2016   Date of Service: the patient was seen and examined on 04/04/2016  Subjective:  Minimal oral intake but denies any nausea or vomiting. Has dizziness when he stands up. But getting better. He Nutrition: Minimal oral intake  Brief hospital course: Pt. with PMH of type 1 diabetes mellitus, HTN; admitted on 04/02/2016, with complaint of nausea and vomiting, was found to have DKA. Currently further plan is continue monitoring her blood sugars.  Assessment and Plan: 1. DKA, type 1 (HCC) Anion gap is currently closed. BMP appears to be relatively stable. Continue IV fluids.  2.type 1 diabetes mellitus. Blood sugar this morning Are running in 60s. I would reduce patient's Levemir from 15 units to 40 units twice a day. Continue sliding scale.  3. Hypernatremia. Likely due to IV fluids. improving on half normal saline.  4. Acute kidney injury. Renal function improving. Continue to monitor.  5.Orthostatic hypotension. From dehydration. Patient is volume responsive based on improvement in orthostasis with IV hydration. Next and patient given 3 L of IV fluid bolus today. Continue IV hydration. Cortisol level normal TSH level normal. No cardiac complaints.  6.borderline intake. Likely from gastritis. Changing Pepcid to PPI.  Pain management: When necessary Tylenol Activity: No indication for physical therapy Bowel regimen: last BM 04/01/2016 Diet: Carb modified diet DVT Prophylaxis: subcutaneous Heparin  Advance goals of care discussion: Full code  Family Communication: no family was present at bedside, at the time of interview.  Disposition:  Discharge to home. Expected discharge date: 04/05/2016, improvement in Orthostasis.  Consultants: None Procedures: None  Antibiotics: Anti-infectives    None        Intake/Output  Summary (Last 24 hours) at 04/04/16 1651 Last data filed at 04/04/16 1621  Gross per 24 hour  Intake           7057.5 ml  Output             1350 ml  Net           5707.5 ml   Filed Weights   04/02/16 1343  Weight: 97.5 kg (215 lb)    Objective: Physical Exam: Vitals:   04/03/16 2105 04/04/16 0640 04/04/16 0742 04/04/16 0750  BP: 125/86 (!) 157/100    Pulse: (!) 101 97    Resp: 12 14    Temp: 98.3 F (36.8 C) 97.8 F (36.6 C)    TempSrc: Oral Oral    SpO2: 100% 100% 100% 100%  Weight:      Height:        General: Alert, Awake and Oriented to Time, Place and Person. Appear in mild distress Eyes: PERRL, Conjunctiva normal ENT: Oral Mucosa clear moist. Neck: no JVD, no Abnormal Mass Or lumps Cardiovascular: S1 and S2 Present, no Murmur, Respiratory: Bilateral Air entry equal and Decreased, Clear to Auscultation, no Crackles, no wheezes Abdomen: Bowel Sound present, Soft and no tenderness Skin: no redness, no Rash  Extremities: no Pedal edema, no calf tenderness Neurologic: Grossly no focal neuro deficit. Bilaterally Equal motor strength  Data Reviewed: CBC:  Recent Labs Lab 04/02/16 1454 04/03/16 0440 04/04/16 0648  WBC 18.4* 14.0* 12.2*  NEUTROABS 16.3*  --   --   HGB 13.9 12.5* 10.5*  HCT 43.9 37.1* 31.6*  MCV 81.3 75.4* 76.5*  PLT 307 282 204   Basic Metabolic Panel:  Recent Labs Lab  04/03/16 0725 04/03/16 1138 04/03/16 1515 04/04/16 0115 04/04/16 0648  NA 149* 146* 145 146* 146*  K 4.3 4.4 4.7 4.3 4.2  CL 123* 124* 121* 122* 120*  CO2 21* 19* 20* 21* 23  GLUCOSE 140* 175* 205* 170* 114*  BUN 74* 66* 64* 51* 41*  CREATININE 1.39* 1.24 1.27* 1.24 1.18  CALCIUM 9.9 9.6 9.6 9.3 9.2  MG  --   --   --  2.2  --     Liver Function Tests: No results for input(s): AST, ALT, ALKPHOS, BILITOT, PROT, ALBUMIN in the last 168 hours. No results for input(s): LIPASE, AMYLASE in the last 168 hours. No results for input(s): AMMONIA in the last 168  hours. Coagulation Profile: No results for input(s): INR, PROTIME in the last 168 hours. Cardiac Enzymes: No results for input(s): CKTOTAL, CKMB, CKMBINDEX, TROPONINI in the last 168 hours. BNP (last 3 results) No results for input(s): PROBNP in the last 8760 hours.  CBG:  Recent Labs Lab 04/03/16 1632 04/03/16 2236 04/04/16 0741 04/04/16 1215 04/04/16 1636  GLUCAP 176* 166* 95 85 66    Studies: No results found.   Scheduled Meds: . enoxaparin (LOVENOX) injection  40 mg Subcutaneous Q24H  . insulin aspart  0-15 Units Subcutaneous TID WC  . insulin aspart  0-5 Units Subcutaneous QHS  . insulin detemir  40 Units Subcutaneous BID  . pantoprazole  40 mg Oral Daily  . sodium chloride flush  10-40 mL Intracatheter Q12H  . sodium chloride flush  3 mL Intravenous Q12H   Continuous Infusions: . sodium chloride 75 mL/hr at 04/04/16 1004   PRN Meds: acetaminophen **OR** acetaminophen, ondansetron **OR** ondansetron (ZOFRAN) IV, sodium chloride flush  Time spent: 30 minutes  Author: Lynden Oxford, MD [facility] Pager: [phone] 04/04/2016 4:51 PM  If 7PM-7AM, please contact night-coverage at [email or web address], password [facility]

## 2016-04-05 LAB — BASIC METABOLIC PANEL
ANION GAP: 2 — AB (ref 5–15)
BUN: 19 mg/dL (ref 6–20)
CHLORIDE: 116 mmol/L — AB (ref 101–111)
CO2: 24 mmol/L (ref 22–32)
Calcium: 8.2 mg/dL — ABNORMAL LOW (ref 8.9–10.3)
Creatinine, Ser: 0.84 mg/dL (ref 0.61–1.24)
Glucose, Bld: 141 mg/dL — ABNORMAL HIGH (ref 65–99)
POTASSIUM: 3.3 mmol/L — AB (ref 3.5–5.1)
SODIUM: 142 mmol/L (ref 135–145)

## 2016-04-05 LAB — GLUCOSE, CAPILLARY
GLUCOSE-CAPILLARY: 66 mg/dL (ref 65–99)
GLUCOSE-CAPILLARY: 122 mg/dL — AB (ref 65–99)
GLUCOSE-CAPILLARY: 152 mg/dL — AB (ref 65–99)
Glucose-Capillary: 64 mg/dL — ABNORMAL LOW (ref 65–99)

## 2016-04-05 LAB — MAGNESIUM: MAGNESIUM: 1.9 mg/dL (ref 1.7–2.4)

## 2016-04-05 MED ORDER — INSULIN PEN NEEDLE 31G X 5 MM MISC: 1.0000 | Freq: Three times a day (TID) | 0 refills | Status: AC

## 2016-04-05 MED ORDER — SODIUM CHLORIDE 0.9 % IV BOLUS (SEPSIS)
1000.0000 mL | Freq: Once | INTRAVENOUS | Status: AC
  Administered 2016-04-05: 1000 mL via INTRAVENOUS

## 2016-04-05 MED ORDER — INSULIN ASPART 100 UNIT/ML ~~LOC~~ SOLN: SUBCUTANEOUS | 0 refills | Status: AC

## 2016-04-05 MED ORDER — "INSULIN SYRINGE 31G X 5/16"" 0.5 ML MISC": 1.0000 | Freq: Three times a day (TID) | 0 refills | Status: AC

## 2016-04-05 MED ORDER — POLYETHYLENE GLYCOL 3350 17 G PO PACK
17.0000 g | PACK | Freq: Every day | ORAL | Status: DC
  Administered 2016-04-05: 17 g via ORAL
  Filled 2016-04-05: qty 1

## 2016-04-05 MED ORDER — PANTOPRAZOLE SODIUM 40 MG PO TBEC: 40.0000 mg | DELAYED_RELEASE_TABLET | Freq: Every day | ORAL | 0 refills | Status: AC

## 2016-04-05 MED ORDER — INSULIN DETEMIR 100 UNIT/ML ~~LOC~~ SOLN
30.0000 [IU] | Freq: Two times a day (BID) | SUBCUTANEOUS | Status: DC
  Administered 2016-04-05: 30 [IU] via SUBCUTANEOUS
  Filled 2016-04-05 (×2): qty 0.3

## 2016-04-05 MED ORDER — LEVEMIR FLEXTOUCH 100 UNIT/ML ~~LOC~~ SOPN: 30.0000 [IU] | PEN_INJECTOR | Freq: Two times a day (BID) | SUBCUTANEOUS | 0 refills | Status: AC

## 2016-04-05 MED ORDER — BLOOD GLUCOSE METER KIT: PACK | 0 refills | Status: AC

## 2016-04-05 MED ORDER — POLYETHYLENE GLYCOL 3350 17 G PO PACK: 17.0000 g | PACK | Freq: Every day | ORAL | 0 refills | Status: AC | PRN

## 2016-04-05 MED ORDER — POTASSIUM CHLORIDE CRYS ER 20 MEQ PO TBCR
40.0000 meq | EXTENDED_RELEASE_TABLET | Freq: Once | ORAL | Status: AC
Start: 2016-04-05 — End: 2016-04-05
  Administered 2016-04-05: 40 meq via ORAL
  Filled 2016-04-05: qty 2

## 2016-04-05 NOTE — Progress Notes (Signed)
Instructed pt on procedure. Instructed pt to hold breath with line removal, HOB less than 45 degrees. Pressure held 5 min with no s/sx of bleeding. Instructed on monitoring and reporting any s/sx of bleeding and to leave drsg CDI for 24hrs. Pt. VU. Tomasita MorrowHeather Cason Dabney, RN VAST

## 2016-04-05 NOTE — Care Management Note (Signed)
Case Management Note  Patient Details  Name: Seth Cruz MRN: [id number] Date of Birth: Feb 07, 1971  Subjective/Objective:                    Action/Plan:d/c home no needs or orders.   Expected Discharge Date:                  Expected Discharge Plan:  Home/Self Care  In-House Referral:     Discharge planning Services  CM Consult, Medication Assistance  Post Acute Care Choice:    Choice offered to:     DME Arranged:    DME Agency:     HH Arranged:    HH Agency:     Status of Service:  Completed, signed off  If discussed at MicrosoftLong Length of Stay Meetings, dates discussed:    Additional Comments:  Lanier ClamMahabir, Harmon Bommarito, RN 04/05/2016, 4:00 PM

## 2016-04-05 NOTE — Progress Notes (Addendum)
Inpatient Diabetes Program Recommendations  AACE/ADA: New Consensus Statement on Inpatient Glycemic Control (2015)  Target Ranges:  Prepandial:   less than 140 mg/dL      Peak postprandial:   less than 180 mg/dL (1-2 hours)      Critically ill patients:  140 - 180 mg/dL   Results for Seth Cruz, Seth Cruz (MRN [id number]) as of 04/05/2016 08:54  Ref. Range 04/04/2016 07:41 04/04/2016 12:15 04/04/2016 16:36 04/04/2016 17:14 04/04/2016 17:44 04/04/2016 22:05  Glucose-Capillary Latest Ref Range: 65 - 99 mg/dL 95 85 66 55 (L) 562101 (H) 90   Results for Seth Cruz, Seth Cruz (MRN [id number]) as of 04/05/2016 08:54  Ref. Range 04/05/2016 08:17 04/05/2016 08:42  Glucose-Capillary Latest Ref Range: 65 - 99 mg/dL 66 64 (L)    Admit with: DKA  History: DM  Home DM Meds: Levemir 50 units bid  Current Insulin Orders: Levemir 40 units bid       Novolog Moderate Correction Scale/ SSI (0-15 units) TID AC + HS      -Current A1c pending.  -Patient with Hypoglycemia yesterday at 4pm and again this AM.  -Note Levemir reduced to 40 units bid yesterday evening.  Patient received a total of 90 units Levemir yesterday (50 units in the AM and 40 units in the PM).     MD- Please consider further reducing Levemir to 35 units bid     Addendum 11:45am: Spoke with pt today about his home DM care.  Patient stated he has appt with Dr. Dorothyann Pengobyn Sanders (PCP) on 04/25/16.  Very concerned about his CBGs and anxious to get his A1c results back.  Stated he has some Novolog at home and that Dr. Allyne GeeSanders told him to take the Novolog at home only when CBG is >300 mg/dl.  Discussed with patient how Levemir and Novolog work and that patient may need further, more intensified insulin regimen at home.  Based on the fact that he has had Hypoglycemia here in the hospital, I speculate that he may be taking too much Levemir at home and that he may need to start taking Novolog tid with meals at home with a lower dose of Levemir.  Encouraged patient to check his  CBG at least bid at home (pt works 6pm-6am and we talked about an appropriate schedule for CBG timing) and Encouraged pt to speak with his PCP about the possibility of taking Novolog tid at home as well.  Reviewed some basic Carbohydrate counting/DM Nutrition guidelines with patient and wife and also gave patient information on purchasing an inexpensive CBG meter and strips at [employer] [facility].  Meter is $9 and a box of 50 strips is $9.  Patient very appreciative of my visit and very motivated to gets his CBGs under control.    --Will follow patient during hospitalization--  Ambrose FinlandJeannine Johnston Daric Koren RN, MSN, CDE Diabetes Coordinator Inpatient Glycemic Control Team Team Pager: [phone] (8a-5p)

## 2016-04-06 LAB — HEMOGLOBIN A1C
Hgb A1c MFr Bld: 13.4 % — ABNORMAL HIGH (ref 4.8–5.6)
Mean Plasma Glucose: 338 mg/dL

## 2016-04-10 NOTE — Discharge Summary (Signed)
[facility] Discharge Summary   Patient: Seth Cruz VOP:[id number]   PCP: Maximino Greenland, MD DOB: 1970-11-13   Date of admission: 04/02/2016   Date of discharge: 04/05/2016    Discharge Diagnoses:  Principal Problem:   DKA, type 1 ([place]) Active Problems:   Hypertension   Acute kidney injury ([place])   Hyperkalemia   Hypernatremia   Admitted From: home Disposition:  home  Recommendations for Outpatient Follow-up:  1. Please remain compliant with diabetes regimen.   Follow-up Information    Maximino Greenland, MD. Schedule an appointment as soon as possible for a visit in 1 week(s).   Specialty:  Internal Medicine Why:  Blood sugar logs Contact information: [address] [place] [address] [phone]        Carlyle Basques, MD .   Specialty:  Infectious Diseases Contact information: [address] [place] [address] [phone]          Diet recommendation: crb modifies diet  Activity: The patient is advised to gradually reintroduce usual activities.  Discharge Condition: good  Code Status: full code  History of present illness: As per the H and P dictated on admission, "Pinchas Reither is a 45 y.o. male with medical history significant of diabetes mellitus type 1, who presents to the hospital with the chief complaint of generalized weakness and generalized malaise for last 48 hours. He ran out of his insulin about 48 hours ago, he was able to get his insulin again yesterday, he spent about one day without insulin. He has been feeling generalized weakness which has been moderate to severe, no improving or worsening factors, persistent for last 48 hours, associated with abdominal pain, polyuria and polydipsia. His serum glucose has been rising up to 400 despite using insulin again for last 24 hours. He usually takes Levemir 30 units twice daily and nasal sinus scale, his serum glucose usually runs about 200 home."  Hospital  Course:  Summary of his active problems in the hospital is as following.  1. DKA, type 1 ([place]) Anion gap is currently closed. BMP appears to be relatively stable.  2.type 1 diabetes mellitus. Blood sugar this morning Are running in 60s. I would reduce patient's Levemir   3. Hypernatremia. Likely due to IV fluids. improving on half normal saline.  4. Acute kidney injury. Renal function improving. Continue to monitor.  5.Orthostatic hypotension. From dehydration. Patient is volume responsive based on improvement in orthostasis with IV hydration.  patient given bolus. Cortisol level normal TSH level normal. No cardiac complaints.  6.poor oral intake. Likely from gastritis. Changing Pepcid to PPI.  All other chronic medical condition were stable during the hospitalization.  Patient was ambulatory without any assistance. On the day of the discharge the patient's vitals were stable, and no other acute medical condition were reported by patient. the patient was felt safe to be discharge at home with family.  Procedures and Results:  none   Consultations:  none  DISCHARGE MEDICATION: Discharge Medication List as of 04/05/2016  4:44 PM    START taking these medications   Details  blood glucose meter kit and supplies Dispense based on patient and insurance preference. Use up to four times daily as directed. (FOR ICD-9 250.00, 250.01)., Print    insulin aspart (NOVOLOG) 100 UNIT/ML injection If blood glucose 70-120: Take 0units, CBG 121-150: 2units, CBG 151-200: 3units, CBG 201-250: 5units, CBG 251-300: 8units, CBG 301-350: 11units, CBG 351-400: 15units, CBG >400: call MD., Print  Insulin Pen Needle 31G X 5 MM MISC 1 Act by Does not apply route 3 (three) times daily before meals., Starting Tue 04/05/2016, Normal    Insulin Syringe-Needle U-100 (INSULIN SYRINGE .5CC/31GX5/16") 31G X 5/16" 0.5 ML MISC 1 Act by Does not apply route 3 (three) times daily before meals.,  Starting Tue 04/05/2016, Normal    pantoprazole (PROTONIX) 40 MG tablet Take 1 tablet (40 mg total) by mouth daily., Starting Tue 04/05/2016, Normal    polyethylene glycol (MIRALAX / GLYCOLAX) packet Take 17 g by mouth daily as needed., Starting Tue 04/05/2016, Normal      CONTINUE these medications which have CHANGED   Details  LEVEMIR FLEXTOUCH 100 UNIT/ML Pen Inject 30 Units into the skin 2 (two) times daily., Starting Tue 04/05/2016, Normal      STOP taking these medications     glucose blood test strip      insulin NPH Human (HUMULIN N,NOVOLIN N) 100 UNIT/ML injection      Insulin Syringe-Needle U-100 (INSULIN SYRINGE 1CC/28G) 28G X 1/2" 1 ML MISC      Lancets (ACCU-CHEK MULTICLIX) lancets      lisinopril (PRINIVIL,ZESTRIL) 20 MG tablet        No Known Allergies Discharge Instructions    Diet - low sodium heart healthy    Complete by:  As directed   Diet Carb Modified    Complete by:  As directed   Discharge instructions    Complete by:  As directed   It is important that you read following instructions as well as go over your medication list with RN to help you understand your care after this hospitalization.  Discharge Instructions: Please follow-up with PCP in one week  Please request your primary care physician to go over all Hospital Tests and Procedure/Radiological results at the follow up,  Please get all Hospital records sent to your PCP by signing hospital release before you go home.   Do not take more than prescribed Pain, Sleep and Anxiety Medications. You were cared for by a hospitalist during your hospital stay. If you have any questions about your discharge medications or the care you received while you were in the hospital after you are discharged, you can call the unit and ask to speak with the hospitalist on call if the hospitalist that took care of you is not available.  Once you are discharged, your primary care physician will handle any further medical  issues. Please note that NO REFILLS for any discharge medications will be authorized once you are discharged, as it is imperative that you return to your primary care physician (or establish a relationship with a primary care physician if you do not have one) for your aftercare needs so that they can reassess your need for medications and monitor your lab values. You Must read complete instructions/literature along with all the possible adverse reactions/side effects for all the Medicines you take and that have been prescribed to you. Take any new Medicines after you have completely understood and accept all the possible adverse reactions/side effects. Wear Seat belts while driving. If you have smoked or chewed Tobacco in the last 2 yrs please stop smoking and/or stop any Recreational drug use.   Increase activity slowly    Complete by:  As directed     Discharge Exam: Filed Weights   04/02/16 1343  Weight: 97.5 kg (215 lb)   Vitals:   04/05/16 0644 04/05/16 1507  BP: (!) 142/91 (!) 146/86  Pulse: 87  84  Resp: 16   Temp: 98.1 F (36.7 C) 98.3 F (36.8 C)   General: Appear in no distress, no Rash; Oral Mucosa moist. Cardiovascular: S1 and S2 Present, no Murmur, no JVD Respiratory: Bilateral Air entry present and Clear to Auscultation, n Crackles, no wheezes Abdomen: Bowel Sound present, Soft and no tenderness Extremities: no Pedal edema, no calf tenderness Neurology: Grossly no focal neuro deficit.  The results of significant diagnostics from this hospitalization (including imaging, microbiology, ancillary and laboratory) are listed below for reference.    Significant Diagnostic Studies: Dg Chest 1 View  Result Date: 04/02/2016 CLINICAL DATA:  Central line placement, diabetes mellitus, hypertension, former smoker EXAM: CHEST 1 VIEW COMPARISON:  Portable exam 1925 hours compared to 03/04/2015 FINDINGS: RIGHT jugular central venous catheter with tip projecting over SVC. Normal heart  size, mediastinal contours, and pulmonary vascularity. Lungs clear. No pleural effusion or pneumothorax. IMPRESSION: No pneumothorax following RIGHT jugular line placement. Electronically Signed   By: Lavonia Dana M.D.   On: 04/02/2016 20:07    Microbiology: Recent Results (from the past 240 hour(s))  MRSA PCR Screening     Status: None   Collection Time: 04/02/16  5:30 PM  Result Value Ref Range Status   MRSA by PCR NEGATIVE NEGATIVE Final    Comment:        The GeneXpert MRSA Assay (FDA approved for NASAL specimens only), is one component of a comprehensive MRSA colonization surveillance program. It is not intended to diagnose MRSA infection nor to guide or monitor treatment for MRSA infections.      Labs: CBC:  Recent Labs Lab 04/04/16 0648  WBC 12.2*  HGB 10.5*  HCT 31.6*  MCV 76.5*  PLT 494   Basic Metabolic Panel:  Recent Labs Lab 04/04/16 0115 04/04/16 0648 04/05/16 0405  NA 146* 146* 142  K 4.3 4.2 3.3*  CL 122* 120* 116*  CO2 21* 23 24  GLUCOSE 170* 114* 141*  BUN 51* 41* 19  CREATININE 1.24 1.18 0.84  CALCIUM 9.3 9.2 8.2*  MG 2.2  --  1.9   Liver Function Tests: No results for input(s): AST, ALT, ALKPHOS, BILITOT, PROT, ALBUMIN in the last 168 hours. No results for input(s): LIPASE, AMYLASE in the last 168 hours. No results for input(s): AMMONIA in the last 168 hours. Cardiac Enzymes: No results for input(s): CKTOTAL, CKMB, CKMBINDEX, TROPONINI in the last 168 hours. BNP (last 3 results) No results for input(s): BNP in the last 8760 hours. CBG:  Recent Labs Lab 04/04/16 2205 04/05/16 0817 04/05/16 0842 04/05/16 0934 04/05/16 1233  GLUCAP 90 66 64* 122* 152*   Time spent: 30 minutes  Signed:  Jayzen Paver  [facility] 04/05/2016   , 10:44 PM

## 2016-12-05 IMAGING — CR DG CHEST 2V
2 series · 2 of 2 positions shown · non-contrast
Comparison: August 16, 2014

CLINICAL DATA: Shortness of breath for 1 day

EXAM:
CHEST  2 VIEW

[w chest lat]
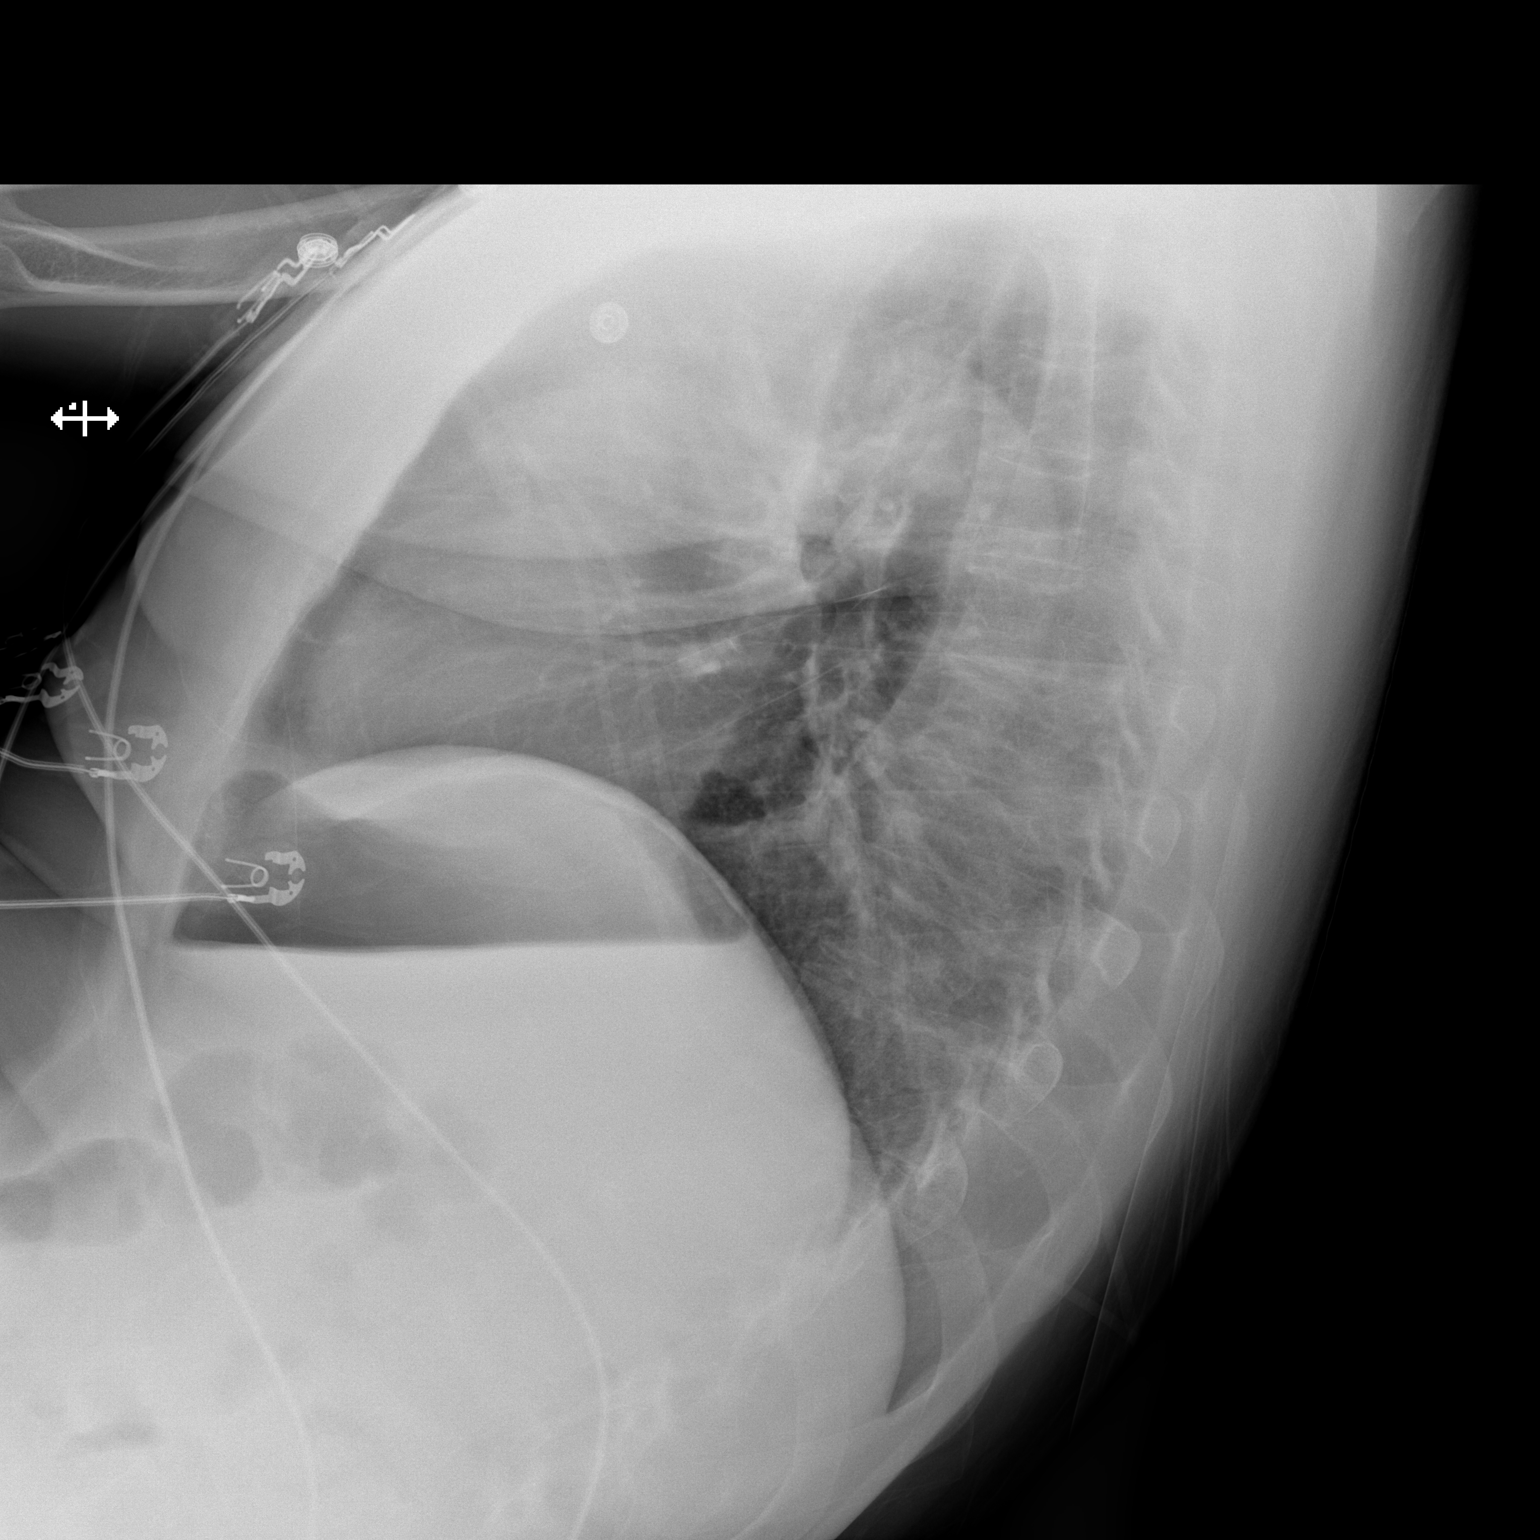

[x chest ap]
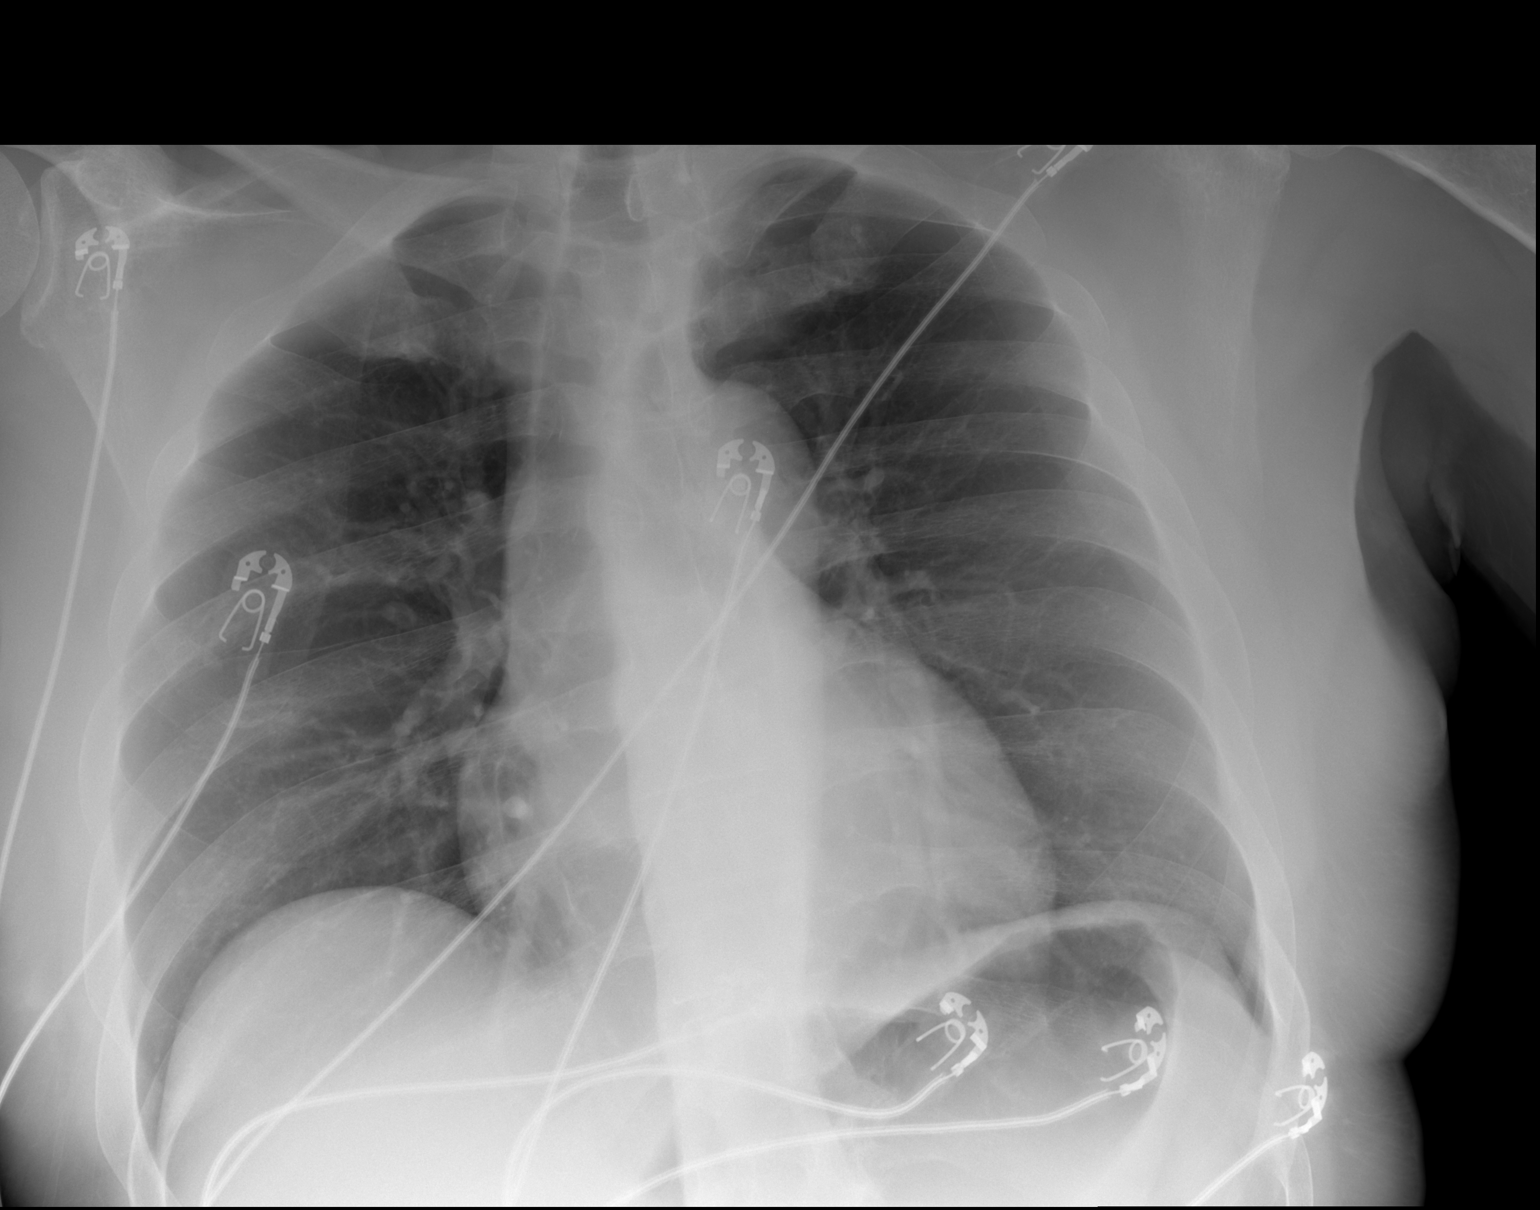

[2 of 2 positions shown; findings below may reference images not displayed]

FINDINGS: There is no edema or consolidation. The heart size and pulmonary
vascularity are normal. No adenopathy. No bone lesions.
IMPRESSION: No edema or consolidation.

## 2017-08-15 ENCOUNTER — Ambulatory Visit: Payer: Self-pay

## 2017-08-15 ENCOUNTER — Other Ambulatory Visit: Payer: Self-pay | Admitting: Occupational Medicine

## 2017-08-15 DIAGNOSIS — Z0000 Encounter for general adult medical examination without abnormal findings: Secondary | ICD-10-CM

## 2018-01-04 IMAGING — DX DG CHEST 1V
1 series · 1 of 1 positions shown · non-contrast
Comparison: Portable exam 0618 hours compared to 03/04/2015

CLINICAL DATA: Central line placement, diabetes mellitus,
hypertension, former smoker

EXAM:
CHEST 1 VIEW

[chest ap]
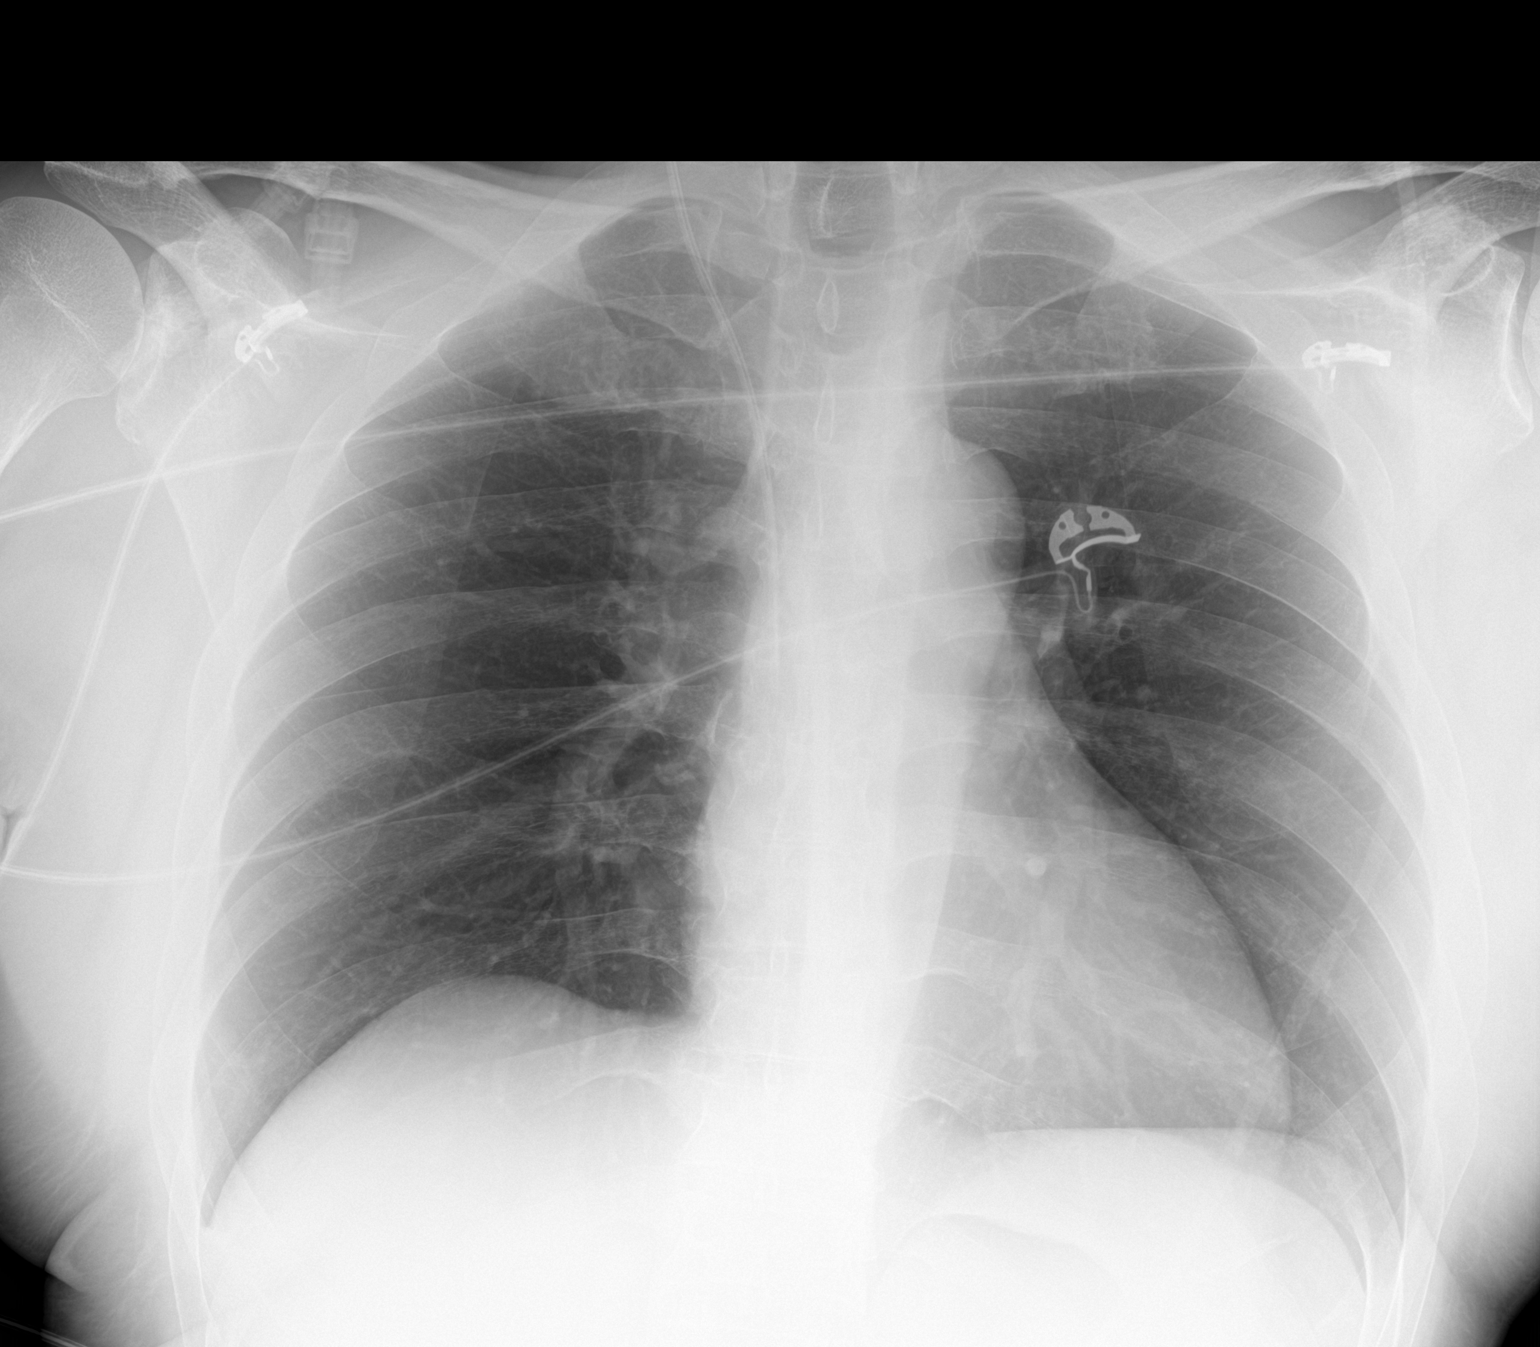

[1 of 1 positions shown; findings below may reference images not displayed]

FINDINGS: RIGHT jugular central venous catheter with tip projecting over SVC.

Normal heart size, mediastinal contours, and pulmonary vascularity.

Lungs clear.

No pleural effusion or pneumothorax.
IMPRESSION: No pneumothorax following RIGHT jugular line placement.

## 2020-08-01 DEATH — deceased
